# Patient Record
Sex: Female | Born: 1965 | Race: Black or African American | Hispanic: No | Marital: Single | Smoking: Never smoker
Health system: Southern US, Community
[De-identification: ages and names within clinical notes are randomized; demographics above are authoritative.]

## PROBLEM LIST (undated history)

## (undated) DIAGNOSIS — K5900 Constipation, unspecified: Secondary | ICD-10-CM

## (undated) DIAGNOSIS — I639 Cerebral infarction, unspecified: Secondary | ICD-10-CM

## (undated) DIAGNOSIS — T7840XA Allergy, unspecified, initial encounter: Secondary | ICD-10-CM

## (undated) DIAGNOSIS — E119 Type 2 diabetes mellitus without complications: Secondary | ICD-10-CM

## (undated) DIAGNOSIS — I10 Essential (primary) hypertension: Secondary | ICD-10-CM

## (undated) DIAGNOSIS — C801 Malignant (primary) neoplasm, unspecified: Secondary | ICD-10-CM

## (undated) HISTORY — DX: Essential (primary) hypertension: I10

## (undated) HISTORY — DX: Cerebral infarction, unspecified: I63.9

## (undated) HISTORY — DX: Type 2 diabetes mellitus without complications: E11.9

## (undated) HISTORY — DX: Allergy, unspecified, initial encounter: T78.40XA

## (undated) HISTORY — DX: Malignant (primary) neoplasm, unspecified: C80.1

## (undated) HISTORY — DX: Constipation, unspecified: K59.00

## (undated) HISTORY — PX: CORONARY ARTERY BYPASS GRAFT: SHX141

---

## 1999-06-29 ENCOUNTER — Encounter: Payer: Self-pay | Admitting: Family Medicine

## 1999-06-29 ENCOUNTER — Encounter: Admission: RE | Admit: 1999-06-29 | Discharge: 1999-06-29 | Payer: Self-pay | Admitting: Family Medicine

## 2002-03-08 ENCOUNTER — Other Ambulatory Visit: Admission: RE | Admit: 2002-03-08 | Discharge: 2002-03-08 | Payer: Self-pay | Admitting: Family Medicine

## 2003-05-30 ENCOUNTER — Emergency Department (HOSPITAL_COMMUNITY): Admission: EM | Admit: 2003-05-30 | Discharge: 2003-05-30 | Payer: Self-pay | Admitting: Emergency Medicine

## 2008-01-25 DIAGNOSIS — R011 Cardiac murmur, unspecified: Secondary | ICD-10-CM

## 2008-01-25 HISTORY — DX: Cardiac murmur, unspecified: R01.1

## 2008-03-06 ENCOUNTER — Ambulatory Visit: Payer: Self-pay | Admitting: Family Medicine

## 2008-03-07 ENCOUNTER — Ambulatory Visit: Payer: Self-pay | Admitting: Family Medicine

## 2008-03-07 ENCOUNTER — Encounter: Payer: Self-pay | Admitting: Family Medicine

## 2008-03-07 ENCOUNTER — Telehealth: Payer: Self-pay | Admitting: Family Medicine

## 2008-03-07 DIAGNOSIS — R011 Cardiac murmur, unspecified: Secondary | ICD-10-CM | POA: Insufficient documentation

## 2008-03-07 DIAGNOSIS — J309 Allergic rhinitis, unspecified: Secondary | ICD-10-CM | POA: Insufficient documentation

## 2008-03-07 LAB — CONVERTED CEMR LAB
BUN: 6 mg/dL (ref 6–23)
CO2: 25 meq/L (ref 19–32)
Calcium: 9.4 mg/dL (ref 8.4–10.5)
Chloride: 99 meq/L (ref 96–112)
Creatinine, Ser: 0.67 mg/dL (ref 0.40–1.20)
Glucose, Bld: 103 mg/dL — ABNORMAL HIGH (ref 70–99)
Potassium: 4.1 meq/L (ref 3.5–5.3)
Sodium: 138 meq/L (ref 135–145)

## 2008-03-09 DIAGNOSIS — I10 Essential (primary) hypertension: Secondary | ICD-10-CM | POA: Insufficient documentation

## 2008-04-02 ENCOUNTER — Encounter: Payer: Self-pay | Admitting: Family Medicine

## 2008-04-16 ENCOUNTER — Encounter: Payer: Self-pay | Admitting: Family Medicine

## 2008-04-16 ENCOUNTER — Ambulatory Visit: Payer: Self-pay | Admitting: Family Medicine

## 2008-04-16 DIAGNOSIS — E669 Obesity, unspecified: Secondary | ICD-10-CM | POA: Insufficient documentation

## 2008-04-16 LAB — CONVERTED CEMR LAB
BUN: 10 mg/dL (ref 6–23)
CO2: 24 meq/L (ref 19–32)
Calcium: 9.2 mg/dL (ref 8.4–10.5)
Chloride: 100 meq/L (ref 96–112)
Cholesterol: 189 mg/dL (ref 0–200)
Creatinine, Ser: 0.74 mg/dL (ref 0.40–1.20)
Glucose, Bld: 103 mg/dL — ABNORMAL HIGH (ref 70–99)
HCT: 36.6 % (ref 36.0–46.0)
HDL: 81 mg/dL (ref >39)
Hemoglobin: 12.1 g/dL (ref 12.0–15.0)
LDL Cholesterol: 92 mg/dL (ref 0–99)
MCHC: 33.1 g/dL (ref 30.0–36.0)
MCV: 86.7 fL (ref 78.0–100.0)
Platelets: 389 10*3/uL (ref 150–400)
Potassium: 3.8 meq/L (ref 3.5–5.3)
RBC: 4.22 M/uL (ref 3.87–5.11)
RDW: 16 % — ABNORMAL HIGH (ref 11.5–15.5)
Sodium: 138 meq/L (ref 135–145)
Total CHOL/HDL Ratio: 2.3
Triglycerides: 80 mg/dL (ref <150)
VLDL: 16 mg/dL (ref 0–40)
WBC: 6.9 10*3/uL (ref 4.0–10.5)

## 2008-04-23 ENCOUNTER — Ambulatory Visit (HOSPITAL_COMMUNITY): Admission: RE | Admit: 2008-04-23 | Discharge: 2008-04-23 | Payer: Self-pay | Admitting: Family Medicine

## 2009-03-31 ENCOUNTER — Telehealth: Payer: Self-pay | Admitting: Family Medicine

## 2009-05-01 ENCOUNTER — Ambulatory Visit: Payer: Self-pay | Admitting: Family Medicine

## 2009-05-01 ENCOUNTER — Other Ambulatory Visit: Admission: RE | Admit: 2009-05-01 | Discharge: 2009-05-01 | Payer: Self-pay | Admitting: Family Medicine

## 2009-05-01 LAB — CONVERTED CEMR LAB: Pap Smear: NEGATIVE

## 2009-05-05 ENCOUNTER — Encounter: Payer: Self-pay | Admitting: Family Medicine

## 2009-06-02 ENCOUNTER — Ambulatory Visit: Payer: Self-pay | Admitting: Family Medicine

## 2009-06-10 ENCOUNTER — Ambulatory Visit (HOSPITAL_COMMUNITY): Admission: RE | Admit: 2009-06-10 | Discharge: 2009-06-10 | Payer: Self-pay | Admitting: Family Medicine

## 2010-02-14 ENCOUNTER — Encounter: Payer: Self-pay | Admitting: Family Medicine

## 2010-02-23 NOTE — Letter (Signed)
Summary: Results Follow-up Letter  [facility]  [address]   [place], [place] [address]   Phone: [phone]  Fax: [phone]    05/05/2009  [address] [place], [place]  [address]  Dear Ms. Payton,   The following are the results of your recent test(s):  Test     Result     Pap Smear    Normal___x__  Not Normal_____       Please keep your appointment as scheduled.    Sincerely,  Lequita Asal  MD [facility]           Appended Document: Results Follow-up Letter mailed.

## 2010-02-23 NOTE — Assessment & Plan Note (Signed)
Summary: cpe,tcb   Vital Signs:  Patient profile:   45 year old female LMP:     [id number] Weight:      197.7 pounds BMI:     30.62 Temp:     98.3 degrees F oral Pulse rate:   96 / minute Pulse rhythm:   regular BP sitting:   171 / 117  (left arm) Cuff size:   regular  Vitals Entered By: Loralee Pacas CMA (May 01, 2009 8:55 AM)  Nutrition Counseling: Patient's BMI is greater than 25 and therefore counseled on weight management options.  Serial Vital Signs/Assessments:  Time      Position  BP       Pulse  Resp  Temp     By                     154/108                        Lequita Asal  MD  LMP (date): [id number]     Enter LMP: [id number] Last PAP Result NEGATIVE FOR INTRAEPITHELIAL LESIONS OR MALIGNANCY.   Primary Care Provider:  Lequita Asal  MD   History of Present Illness: 45 y/o here for CPE  HTN- BP deteriorated. has been out meds x1 month. rx sent to wrong pharmacy. denies CP, SOB, edema, palpitations, headache, blurred vision.   overweight- has gained 22 pounds since last visit. reports no current exercise.   social- not dating. lives with daughter. 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 denies symptoms of depression.   Habits & Providers  Alcohol-Tobacco-Diet     Tobacco Status: never     Tobacco Counseling: not indicated; no tobacco use  Exercise-Depression-Behavior     Have you felt down or hopeless? no     Have you felt little pleasure in things? no     Depression Counseling: not indicated; screening negative for depression     STD Risk: never     STD Risk Counseling: not indicated-no STD risk noted  Current Medications (verified): 1)  None  Allergies (verified): No Known Drug Allergies  Past History:  Past medical, surgical, family and social histories (including risk factors) reviewed, and no changes noted (except as noted below).  Past Medical History: Reviewed history from 03/07/2008 and no changes  required. Hypertension Allergic rhinitis    Past Surgical History: Reviewed history from 03/07/2008 and no changes required. none  Family History: Reviewed history from 03/07/2008 and no changes required. Father - CAD, CABG age 64, CVA age 28, Diabetes age 63  (twin) Brother - CAD, CABG age 63, Diabetes age 33 Mother - Hypertension, Arthritis    Social History: Reviewed history from 03/07/2008 and no changes required. currently working at [employer]. Recently completed associates degree for medical coding/billing. Has 1 daughter who lives with her, age 2. denies EtOH, drugs, tobacco. Single.  STD Risk:  never  Physical Exam  General:  obese female. NAD. vitals reviewed.  Eyes:  No corneal or conjunctival inflammation noted. EOMI. Perrla. Vision grossly normal. Ears:  External ear exam shows no significant lesions or deformities.  Hearing is grossly normal bilaterally. Nose:  External nasal examination shows no deformity or inflammation. Nasal mucosa are pink and moist without lesions or exudates. Mouth:  Oral mucosa and oropharynx without lesions or exudates.  Teeth in good repair. Neck:  No deformities, masses, or tenderness noted. Breasts:  deferred Lungs:  Normal respiratory  effort, chest expands symmetrically. Lungs are clear to auscultation, no crackles or wheezes. Heart:  Normal rate and regular rhythm. S1 and S2 normal. II/VI SEM at LUSB Abdomen:  Bowel sounds positive,abdomen soft and non-tender without masses, organomegaly or hernias noted. Genitalia:  Normal introitus for age, no external lesions, no vaginal discharge, mucosa pink and moist, no vaginal or cervical lesions, no vaginal atrophy, no friaility or hemorrhage, normal uterus size and position, no adnexal masses or tenderness Msk:  No deformity or scoliosis noted of thoracic or lumbar spine.   Pulses:  R and L carotid,radial,dorsalis pedis and posterior tibial pulses are full and equal  bilaterally Extremities:  No clubbing, cyanosis, edema, or deformity noted with normal full range of motion of all joints.   Neurologic:  alert & oriented X3, cranial nerves II-XII intact, strength normal in all extremities, and gait normal.   Skin:  Intact without suspicious lesions or rashes Cervical Nodes:  No lymphadenopathy noted Psych:  Cognition and judgment appear intact. Alert and cooperative with normal attention span and concentration. No apparent delusions, illusions, hallucinations   Impression & Recommendations:  Problem # 1:  ROUTINE GYNECOLOGICAL EXAMINATION (ICD-V72.31) Assessment Unchanged  pap completed. if normal would go to less frequent testing. long discussion about weight. patient to call and schedule mammogram  Orders: [facility] - Est  40-64 yrs ([address])  Problem # 2:  HYPERTENSION, BENIGN ESSENTIAL, UNCONTROLLED (ICD-401.1) Assessment: Deteriorated patient out of meds. refill sent to pharmacy. f/u 1 month  Her updated medication list for this problem includes:    Lisinopril-hydrochlorothiazide 20-25 Mg Tabs (Lisinopril-hydrochlorothiazide) ..... One tab by mouth daily  Problem # 3:  OBESITY (ICD-278.00) Assessment: Deteriorated patient counseled to exercise most days weekly for at least 30 minutes and to modify eating behaviors.   Other Orders: Pap Smear-[facility] ([id number])  Patient Instructions: 1)  Follow up with Dr. Lanier Prude in 1 month about blood pressure.    Prevention & Chronic Care Immunizations   Influenza vaccine: refused  (04/16/2008)   Influenza vaccine due: 04/16/2009    Tetanus booster: 04/16/2008: given   Tetanus booster due: 04/17/2018    Pneumococcal vaccine: Not documented  Other Screening   Pap smear: NEGATIVE FOR INTRAEPITHELIAL LESIONS OR MALIGNANCY.  (04/16/2008)    Mammogram: ASSESSMENT: Negative - BI-RADS 1^MM DIGITAL SCREENING  (04/23/2008)   Smoking status: never  (05/01/2009)  Lipids   Total Cholesterol: 189   (04/16/2008)   LDL: 92  (04/16/2008)   LDL Direct: Not documented   HDL: 81  (04/16/2008)   Triglycerides: 80  (04/16/2008)  Hypertension   Last Blood Pressure: 171 / 117  (05/01/2009)   Serum creatinine: 0.74  (04/16/2008)   Serum potassium 3.8  (04/16/2008)    Hypertension flowsheet reviewed?: Yes   Progress toward BP goal: Deteriorated  Self-Management Support :   Personal Goals (by the next clinic visit) :      Personal blood pressure goal: 140/90  (05/01/2009)   Patient will work on the following items until the next clinic visit to reach self-care goals:     Medications and monitoring: take my medicines every day, check my blood pressure  (05/01/2009)     Eating: eat foods that are low in salt, eat baked foods instead of fried foods  (05/01/2009)     Activity: take a 30 minute walk every day  (05/01/2009)    Hypertension self-management support: Written self-care plan, Education handout  (05/01/2009)   Hypertension self-care plan printed.   Hypertension education handout printed

## 2010-02-23 NOTE — Progress Notes (Signed)
Summary: Rx Req  Phone Note Refill Request Call back at Home Phone [phone] Message from:  Patient  Refills Requested: Medication #1:  LISINOPRIL-HYDROCHLOROTHIAZIDE 10-12.5 MG TABS one tab by mouth daily. PT USES [facility] [address]  Initial call taken by: Clydell Hakim,  March 31, 2009 3:01 PM  Follow-up for Phone Call        rx for 1 month sent today. patient needs appt for any further refills Follow-up by: Lequita Asal  MD,  March 31, 2009 4:15 PM  Additional Follow-up for Phone Call Additional follow up Details #1::        Called No answer Additional Follow-up by: Gladstone Pih,  April 01, 2009 1:35 PM

## 2010-02-23 NOTE — Assessment & Plan Note (Signed)
Summary: FU BP/KH   Vital Signs:  Patient profile:   45 year old female Height:      67.5 inches Weight:      196.4 pounds BMI:     30.42 Temp:     97.9 degrees F oral Pulse rate:   87 / minute BP sitting:   124 / 88  (left arm) Cuff size:   regular  Vitals Entered By: Gladstone Pih (Jun 02, 2009 2:11 PM)  Serial Vital Signs/Assessments:  Time      Position  BP       Pulse  Resp  Temp     By 2:13 PM             124/88                         Gladstone Pih  Comments: 2:13 PM re checked manually By: Gladstone Pih   CC: F/U HTN Is Patient Diabetic? No Pain Assessment Patient in pain? no        Primary Care Provider:  Lequita Asal  MD  CC:  F/U HTN.  History of Present Illness: 45 y/o female here for f/u HTN  HTN- on lisinopril-hctz. taking daily. no issues. denies chest pain, palpitations, headache, blurred vision, peripheral edema.   Habits & Providers  Alcohol-Tobacco-Diet     Tobacco Status: never  -  Date:  06/02/2009    SBP 124    DBP 88  Current Medications (verified): 1)  Lisinopril-Hydrochlorothiazide 20-25 Mg Tabs (Lisinopril-Hydrochlorothiazide) .... One Tab By Mouth Daily  Allergies (verified): No Known Drug Allergies  Physical Exam  General:  obese female. NAD. vitals reviewed.  Mouth:  Oral mucosa and oropharynx without lesions or exudates.  Teeth in good repair. Lungs:  Normal respiratory effort, chest expands symmetrically. Lungs are clear to auscultation, no crackles or wheezes. Heart:  Normal rate and regular rhythm. S1 and S2 normal. II/VI SEM at LUSB Extremities:  No clubbing, cyanosis, edema, or deformity noted with normal full range of motion of all joints.     Impression & Recommendations:  Problem # 1:  HYPERTENSION, BENIGN ESSENTIAL, UNCONTROLLED (ICD-401.1) Assessment Improved  no changes.   Her updated medication list for this problem includes:    Lisinopril-hydrochlorothiazide 20-25 Mg Tabs  (Lisinopril-hydrochlorothiazide) ..... One tab by mouth daily  Orders: [facility]- Est Level  3 ([address]) Prescriptions: LISINOPRIL-HYDROCHLOROTHIAZIDE 20-25 MG TABS (LISINOPRIL-HYDROCHLOROTHIAZIDE) one tab by mouth daily  #30 x 3   Entered and Authorized by:   Lequita Asal  MD   Signed by:   Lequita Asal  MD on 06/02/2009   Method used:   Electronically to        [facility] [phone]* (retail)       [address]       [place], [place]  [address]       Ph: [phone]       Fax: [phone]   RxID:   [id number]    Prevention & Chronic Care Immunizations   Influenza vaccine: refused  (04/16/2008)   Influenza vaccine due: 04/16/2009    Tetanus booster: 04/16/2008: given   Tetanus booster due: 04/17/2018    Pneumococcal vaccine: Not documented  Other Screening   Pap smear: NEGATIVE FOR INTRAEPITHELIAL LESIONS OR MALIGNANCY.  (05/01/2009)    Mammogram: ASSESSMENT: Negative - BI-RADS 1^MM DIGITAL SCREENING  (04/23/2008)   Smoking status: never  (06/02/2009)  Lipids   Total Cholesterol: 189  (04/16/2008)   LDL:  92  (04/16/2008)   LDL Direct: Not documented   HDL: 81  (04/16/2008)   Triglycerides: 80  (04/16/2008)  Hypertension   Last Blood Pressure: 124 / 88  (06/02/2009)   Serum creatinine: 0.74  (04/16/2008)   Serum potassium 3.8  (04/16/2008)    Hypertension flowsheet reviewed?: Yes   Progress toward BP goal: At goal  Self-Management Support :   Personal Goals (by the next clinic visit) :      Personal blood pressure goal: 140/90  (05/01/2009)   Hypertension self-management support: Written self-care plan, Education handout  (05/01/2009)    Hypertension self-management support not done because: Good outcomes  (06/02/2009)

## 2013-11-21 ENCOUNTER — Other Ambulatory Visit: Payer: Self-pay | Admitting: Family Medicine

## 2013-11-21 DIAGNOSIS — Z1231 Encounter for screening mammogram for malignant neoplasm of breast: Secondary | ICD-10-CM

## 2013-11-25 ENCOUNTER — Encounter (INDEPENDENT_AMBULATORY_CARE_PROVIDER_SITE_OTHER): Payer: Self-pay

## 2013-11-25 ENCOUNTER — Ambulatory Visit
Admission: RE | Admit: 2013-11-25 | Discharge: 2013-11-25 | Disposition: A | Discharge status: Home / Self Care | Payer: Private Health Insurance - Indemnity | Source: Ambulatory Visit | Attending: Family Medicine | Admitting: Family Medicine

## 2013-11-25 DIAGNOSIS — Z1231 Encounter for screening mammogram for malignant neoplasm of breast: Secondary | ICD-10-CM

## 2014-03-26 ENCOUNTER — Encounter: Payer: Self-pay | Admitting: Internal Medicine

## 2014-05-12 ENCOUNTER — Ambulatory Visit (AMBULATORY_SURGERY_CENTER): Payer: Self-pay | Admitting: *Deleted

## 2014-05-12 VITALS — Ht 67.0 in | Wt 170.0 lb

## 2014-05-12 DIAGNOSIS — Z1211 Encounter for screening for malignant neoplasm of colon: Secondary | ICD-10-CM

## 2014-05-12 MED ORDER — MOVIPREP 100 G PO SOLR: 1.0000 | Freq: Once | ORAL | Status: DC

## 2014-05-12 NOTE — Progress Notes (Signed)
No egg or soy allergy No diet pills No home 02 use No past sedation emmi video to e mail

## 2014-05-26 ENCOUNTER — Ambulatory Visit (AMBULATORY_SURGERY_CENTER): Payer: Managed Care, Other (non HMO) | Admitting: Internal Medicine

## 2014-05-26 ENCOUNTER — Encounter: Payer: Self-pay | Admitting: Internal Medicine

## 2014-05-26 VITALS — BP 121/79 | HR 75 | Temp 98.6°F | Resp 11 | Ht 67.0 in | Wt 170.0 lb

## 2014-05-26 DIAGNOSIS — Z1211 Encounter for screening for malignant neoplasm of colon: Secondary | ICD-10-CM

## 2014-05-26 MED ORDER — SODIUM CHLORIDE 0.9 % IV SOLN: 500.0000 mL | INTRAVENOUS | Status: DC

## 2014-05-26 NOTE — Op Note (Signed)
[facility]  [address]. [place], [address]   COLONOSCOPY PROCEDURE REPORT  PATIENT: Jenna, Myers  MR#: [id number] BIRTHDATE: 1965-09-23 , 49  yrs. old GENDER: female ENDOSCOPIST: Jerene Bears, MD PROCEDURE DATE:  05/26/2014 PROCEDURE:   Colonoscopy, screening First Screening Colonoscopy - Avg.  risk and is 50 yrs.  old or older Yes.  Prior Negative Screening - Now for repeat screening. N/A  History of Adenoma - Now for follow-up colonoscopy & has been > or = to 3 yrs.  N/A ASA CLASS:   Class II INDICATIONS:Screening for colonic neoplasia and Colorectal Neoplasm Risk Assessment for this procedure is average risk. MEDICATIONS: Monitored anesthesia care and Propofol 300 mg IV  DESCRIPTION OF PROCEDURE:   After the risks benefits and alternatives of the procedure were thoroughly explained, informed consent was obtained.  The digital rectal exam revealed no rectal mass.   The LB [id number]  endoscope was introduced through the anus and advanced to the terminal ileum which was intubated for a short distance. No adverse events experienced.   The quality of the prep was excellent.  (MoviPrep was used)  The instrument was then slowly withdrawn as the colon was fully examined.      COLON FINDINGS: The examined terminal ileum appeared to be normal. There was mild diverticulosis noted in the ascending colon, descending colon, and sigmoid colon.   The examination was otherwise normal.  Retroflexed views revealed no abnormalities. The time to cecum = 7.1 Withdrawal time = 8.7   The scope was withdrawn and the procedure completed.  COMPLICATIONS: There were no immediate complications.  ENDOSCOPIC IMPRESSION: 1.   The examined terminal ileum appeared to be normal 2.   Mild diverticulosis was noted in the ascending colon, descending colon, and sigmoid colon 3.   The examination was otherwise normal  RECOMMENDATIONS: 1.  High fiber diet 2.  You should  continue to follow colorectal cancer screening guidelines for "routine risk" patients with a repeat colonoscopy in 10 years.  There is no need for FOBT (stool) testing for at least 5 years.  eSigned:  Jerene Bears, MD 05/26/2014 8:31 AM   cc:  the patient

## 2014-05-26 NOTE — Progress Notes (Signed)
Report to PACU, RN, vss, BBS= Clear.  

## 2014-05-26 NOTE — Patient Instructions (Signed)
Discharge instructions given. Handouts on diverticulosis and a high fiber diet Resume previous medications. YOU HAD AN ENDOSCOPIC PROCEDURE TODAY AT THE [facility]:   Refer to the procedure report that was given to you for any specific questions about what was found during the examination.  If the procedure report does not answer your questions, please call your gastroenterologist to clarify.  If you requested that your care partner not be given the details of your procedure findings, then the procedure report has been included in a sealed envelope for you to review at your convenience later.  YOU SHOULD EXPECT: Some feelings of bloating in the abdomen. Passage of more gas than usual.  Walking can help get rid of the air that was put into your GI tract during the procedure and reduce the bloating. If you had a lower endoscopy (such as a colonoscopy or flexible sigmoidoscopy) you may notice spotting of blood in your stool or on the toilet paper. If you underwent a bowel prep for your procedure, you may not have a normal bowel movement for a few days.  Please Note:  You might notice some irritation and congestion in your nose or some drainage.  This is from the oxygen used during your procedure.  There is no need for concern and it should clear up in a day or so.  SYMPTOMS TO REPORT IMMEDIATELY:   Following lower endoscopy (colonoscopy or flexible sigmoidoscopy):  Excessive amounts of blood in the stool  Significant tenderness or worsening of abdominal pains  Swelling of the abdomen that is new, acute  Fever of 100F or higher   For urgent or emergent issues, a gastroenterologist can be reached at any hour by calling [phone].   DIET: Your first meal following the procedure should be a small meal and then it is ok to progress to your normal diet. Heavy or fried foods are harder to digest and may make you feel nauseous or bloated.  Likewise, meals heavy in dairy and vegetables  can increase bloating.  Drink plenty of fluids but you should avoid alcoholic beverages for 24 hours.  ACTIVITY:  You should plan to take it easy for the rest of today and you should NOT DRIVE or use heavy machinery until tomorrow (because of the sedation medicines used during the test).    FOLLOW UP: Our staff will call the number listed on your records the next business day following your procedure to check on you and address any questions or concerns that you may have regarding the information given to you following your procedure. If we do not reach you, we will leave a message.  However, if you are feeling well and you are not experiencing any problems, there is no need to return our call.  We will assume that you have returned to your regular daily activities without incident.  If any biopsies were taken you will be contacted by phone or by letter within the next 1-3 weeks.  Please call us at [phone] if you have not heard about the biopsies in 3 weeks.    SIGNATURES/CONFIDENTIALITY: You and/or your care partner have signed paperwork which will be entered into your electronic medical record.  These signatures attest to the fact that that the information above on your After Visit Summary has been reviewed and is understood.  Full responsibility of the confidentiality of this discharge information lies with you and/or your care-partner. 

## 2014-05-27 ENCOUNTER — Telehealth: Payer: Self-pay

## 2014-05-27 NOTE — Telephone Encounter (Signed)
Duplicate entry

## 2014-05-27 NOTE — Telephone Encounter (Signed)
  Follow up Call-  Call back number 05/26/2014  Post procedure Call Back phone  # [phone]  Permission to leave phone message Yes     Patient questions:  Do you have a fever, pain , or abdominal swelling? No. Pain Score  0 *  Have you tolerated food without any problems? Yes.    Have you been able to return to your normal activities? Yes.    Do you have any questions about your discharge instructions: Diet   No. Medications  No. Follow up visit  No.  Do you have questions or concerns about your Care? No.  Actions: * If pain score is 4 or above: No action needed, pain <4.

## 2014-05-27 NOTE — Telephone Encounter (Signed)
  Follow up Call-  Call back number 05/26/2014  Post procedure Call Back phone  # [phone]  Permission to leave phone message Yes     Patient questions:  Do you have a fever, pain , or abdominal swelling? No. Pain Score  0 *  Have you tolerated food without any problems? Yes.    Have you been able to return to your normal activities? Yes.    Do you have any questions about your discharge instructions: Diet   No. Medications  No. Follow up visit  No.  Do you have questions or concerns about your Care? No.  Actions: * If pain score is 4 or above: No action needed, pain <4.

## 2015-01-02 ENCOUNTER — Other Ambulatory Visit: Payer: Self-pay

## 2015-01-02 DIAGNOSIS — Z1231 Encounter for screening mammogram for malignant neoplasm of breast: Secondary | ICD-10-CM

## 2015-01-05 ENCOUNTER — Ambulatory Visit
Admission: RE | Admit: 2015-01-05 | Discharge: 2015-01-05 | Disposition: A | Discharge status: Home / Self Care | Payer: Managed Care, Other (non HMO) | Source: Ambulatory Visit

## 2015-01-05 DIAGNOSIS — Z1231 Encounter for screening mammogram for malignant neoplasm of breast: Secondary | ICD-10-CM

## 2015-12-29 ENCOUNTER — Other Ambulatory Visit: Payer: Self-pay | Admitting: Specialist

## 2015-12-29 DIAGNOSIS — Z1231 Encounter for screening mammogram for malignant neoplasm of breast: Secondary | ICD-10-CM

## 2016-02-03 ENCOUNTER — Ambulatory Visit
Admission: RE | Admit: 2016-02-03 | Discharge: 2016-02-03 | Disposition: A | Discharge status: Home / Self Care | Payer: 59 | Source: Ambulatory Visit | Attending: Specialist | Admitting: Specialist

## 2016-02-03 DIAGNOSIS — Z1231 Encounter for screening mammogram for malignant neoplasm of breast: Secondary | ICD-10-CM

## 2017-01-20 ENCOUNTER — Other Ambulatory Visit: Payer: Self-pay | Admitting: Family Medicine

## 2017-01-20 DIAGNOSIS — Z1231 Encounter for screening mammogram for malignant neoplasm of breast: Secondary | ICD-10-CM

## 2017-02-13 ENCOUNTER — Ambulatory Visit
Admission: RE | Admit: 2017-02-13 | Discharge: 2017-02-13 | Disposition: A | Discharge status: Home / Self Care | Payer: 59 | Source: Ambulatory Visit | Attending: Family Medicine | Admitting: Family Medicine

## 2017-02-13 DIAGNOSIS — Z1231 Encounter for screening mammogram for malignant neoplasm of breast: Secondary | ICD-10-CM

## 2018-12-04 ENCOUNTER — Other Ambulatory Visit: Payer: Self-pay

## 2018-12-04 ENCOUNTER — Ambulatory Visit
Admission: EM | Admit: 2018-12-04 | Discharge: 2018-12-04 | Disposition: A | Discharge status: Home / Self Care | Payer: 59 | Attending: Physician Assistant | Admitting: Physician Assistant

## 2018-12-04 DIAGNOSIS — J069 Acute upper respiratory infection, unspecified: Secondary | ICD-10-CM

## 2018-12-04 DIAGNOSIS — Z20828 Contact with and (suspected) exposure to other viral communicable diseases: Secondary | ICD-10-CM | POA: Diagnosis not present

## 2018-12-04 DIAGNOSIS — I10 Essential (primary) hypertension: Secondary | ICD-10-CM

## 2018-12-04 DIAGNOSIS — J029 Acute pharyngitis, unspecified: Secondary | ICD-10-CM

## 2018-12-04 MED ORDER — LISINOPRIL 20 MG PO TABS: 20.0000 mg | ORAL_TABLET | Freq: Every day | ORAL | 0 refills | Status: DC

## 2018-12-04 MED ORDER — BENZONATATE 100 MG PO CAPS
100.0000 mg | ORAL_CAPSULE | Freq: Three times a day (TID) | ORAL | 0 refills | Status: DC
Start: 2018-12-04 — End: 2019-03-25

## 2018-12-04 MED ORDER — GLIPIZIDE ER 2.5 MG PO TB24: 2.5000 mg | ORAL_TABLET | Freq: Every day | ORAL | 0 refills | Status: DC

## 2018-12-04 MED ORDER — IPRATROPIUM BROMIDE 0.06 % NA SOLN: 2.0000 | Freq: Four times a day (QID) | NASAL | 0 refills | Status: DC

## 2018-12-04 MED ORDER — HYDROCHLOROTHIAZIDE 25 MG PO TABS: 25.0000 mg | ORAL_TABLET | Freq: Every day | ORAL | 0 refills | Status: DC

## 2018-12-04 NOTE — Discharge Instructions (Signed)
As discussed, cannot rule out COVID. Currently, no alarming signs. Testing ordered. I would like you to quarantine until testing results. Start tessalon for cough. Atrovent for drainage. If experiencing shortness of breath, trouble breathing, go to the emergency department for further evaluation needed.   Medicines refilled for 90 days, follow up with PCP for further evaluation needed.

## 2018-12-04 NOTE — ED Triage Notes (Signed)
Pt c/o sore throat, cough, fever, and "feeling bad" all day at work. States "lotes of positive COVID people their". States hasn't took her homes meds in months d/t unable to see the PCP d/t COVID

## 2018-12-04 NOTE — ED Provider Notes (Signed)
[facility]    CSN: [id number] Arrival date & time: 12/04/18  1847      History   Chief Complaint Chief Complaint  Patient presents with  . Sore Throat    HPI Jenna Myers is a 53 y.o. female.   53 year old female comes in for 2 day history of URI symptoms. Has had sore throat, cough, rhinorrhea, nasal congestion, body aches, subjective fever. Nausea without vomiting. Denies abdominal pain, diarrhea. Denies shortness of breath, loss of taste/smell.  Denies weakness, dizziness, syncope.  Denies chest pain, palpitations. Positive COVID contact. Motrin 400mg  2 hours ago. Never smoker.   Patient hypertensive, with tachycardia at triage.  States has been out of her blood pressure medicine for a few months.  Has also been out of diabetic medications.     Past Medical History:  Diagnosis Date  . Allergy    seasonal  . Constipation    prune juice and probiotic helps  . Diabetes mellitus without complication (HCC)    questionable-Hgb A1C down to 6.7  . Hypertension     Patient Active Problem List   Diagnosis Date Noted  . OBESITY 04/16/2008  . HYPERTENSION, BENIGN ESSENTIAL, UNCONTROLLED 03/09/2008  . ALLERGIC RHINITIS 03/07/2008  . HEART MURMUR, SYSTOLIC [id number]    Past Surgical History:  Procedure Laterality Date  . VAGINAL DELIVERY     x1    OB History   No obstetric history on file.      Home Medications    Prior to Admission medications   Medication Sig Start Date End Date Taking? Authorizing Provider  benzonatate (TESSALON) 100 MG capsule Take 1 capsule (100 mg total) by mouth every 8 (eight) hours. 12/04/18   Tasia Catchings,  V, PA-C  glipiZIDE (GLUCOTROL XL) 2.5 MG 24 hr tablet Take 1 tablet (2.5 mg total) by mouth daily with breakfast. 12/04/18   Tasia Catchings,  V, PA-C  hydrochlorothiazide (HYDRODIURIL) 25 MG tablet Take 1 tablet (25 mg total) by mouth daily. 12/04/18   Tasia Catchings,  V, PA-C  ipratropium (ATROVENT) 0.06 % nasal spray Place 2 sprays into  both nostrils 4 (four) times daily. 12/04/18   Tasia Catchings,  V, PA-C  lisinopril (ZESTRIL) 20 MG tablet Take 1 tablet (20 mg total) by mouth daily. 12/04/18   Tasia Catchings,  V, PA-C  Multiple Vitamins-Minerals (MULTIVITAMIN ADULT PO) Take 1 capsule by mouth daily.    [provider]  Probiotic Product (PROBIOTIC PO) Take 1 capsule by mouth daily. tru biotic daily    [provider]    Family History Family History  Problem Relation Age of Onset  . Heart disease Father   . Colon cancer Neg Hx     Social History Social History   Tobacco Use  . Smoking status: Never Smoker  . Smokeless tobacco: Never Used  Substance Use Topics  . Alcohol use: No    Alcohol/week: 0.0 standard drinks  . Drug use: No     Allergies   Patient has no known allergies.   Review of Systems Review of Systems  Reason unable to perform ROS: See HPI as above.     Physical Exam Triage Vital Signs ED Triage Vitals [12/04/18 1902]  Enc Vitals Group     BP (!) 181/118     Pulse Rate (!) 127     Resp 18     Temp 100.1 F (37.8 C)     Temp Source Oral     SpO2 95 %  Weight      Height      Head Circumference      Peak Flow      Pain Score 0     Pain Loc      Pain Edu?      Excl. in [place]?    No data found.  Updated Vital Signs BP (!) 181/118 (BP Location: Left Arm)   Pulse (!) 127   Temp 100.1 F (37.8 C) (Oral)   Resp 18   SpO2 95%   Physical Exam Constitutional:      General: She is not in acute distress.    Appearance: Normal appearance. She is not ill-appearing, toxic-appearing or diaphoretic.  HENT:     Head: Normocephalic and atraumatic.     Right Ear: Tympanic membrane and ear canal normal.     Left Ear: Tympanic membrane and ear canal normal.     Mouth/Throat:     Mouth: Mucous membranes are moist.     Pharynx: Oropharynx is clear. Uvula midline.     Tonsils: No tonsillar exudate. 2+ on the right. 2+ on the left.  Neck:     Musculoskeletal: Normal range of motion  and neck supple.  Cardiovascular:     Rate and Rhythm: Regular rhythm. Tachycardia present.     Heart sounds: Normal heart sounds. No murmur. No friction rub. No gallop.   Pulmonary:     Effort: Pulmonary effort is normal. No accessory muscle usage, prolonged expiration, respiratory distress or retractions.     Comments: Lungs clear to auscultation without adventitious lung sounds. Neurological:     General: No focal deficit present.     Mental Status: She is alert and oriented to person, place, and time.      UC Treatments / Results  Labs (all labs ordered are listed, but only abnormal results are displayed) Labs Reviewed  NOVEL CORONAVIRUS, NAA    EKG   Radiology No results found.  Procedures Procedures (including critical care time)  Medications Ordered in UC Medications - No data to display  Initial Impression / Assessment and Plan / UC Course  I have reviewed the triage vital signs and the nursing notes.  Pertinent labs & imaging results that were available during my care of the patient were reviewed by me and considered in my medical decision making (see chart for details).    No alarming signs on exam.  Patient speaking in full sentences without respiratory distress.  COVID testing ordered.  Patient to quarantine until testing results return.  Symptomatic treatment discussed.  Push fluids.  Return precautions given.  Patient expresses understanding and agrees to plan.  We will refill patient's medications for 90 days.  Resources for PCP provided.  Return precautions given.   Final Clinical Impressions(s) / UC Diagnoses   Final diagnoses:  Sore throat  Viral URI   ED Prescriptions    Medication Sig Dispense Auth. Provider   glipiZIDE (GLUCOTROL XL) 2.5 MG 24 hr tablet Take 1 tablet (2.5 mg total) by mouth daily with breakfast. 90 tablet ,  V, PA-C   hydrochlorothiazide (HYDRODIURIL) 25 MG tablet Take 1 tablet (25 mg total) by mouth daily. 90 tablet ,   V, PA-C   lisinopril (ZESTRIL) 20 MG tablet Take 1 tablet (20 mg total) by mouth daily. 90 tablet ,  V, PA-C   benzonatate (TESSALON) 100 MG capsule Take 1 capsule (100 mg total) by mouth every 8 (eight) hours. 21 capsule Ok Edwards, PA-C  ipratropium (ATROVENT) 0.06 % nasal spray Place 2 sprays into both nostrils 4 (four) times daily. 15 mL Ok Edwards, PA-C     PDMP not reviewed this encounter.   Ok Edwards, PA-C 12/04/18 1944

## 2018-12-08 LAB — NOVEL CORONAVIRUS, NAA: SARS-CoV-2, NAA: NOT DETECTED

## 2018-12-11 ENCOUNTER — Ambulatory Visit
Admission: EM | Admit: 2018-12-11 | Discharge: 2018-12-11 | Disposition: A | Discharge status: Home / Self Care | Payer: 59 | Attending: Physician Assistant | Admitting: Physician Assistant

## 2018-12-11 ENCOUNTER — Encounter: Payer: Self-pay | Admitting: Physician Assistant

## 2018-12-11 ENCOUNTER — Telehealth: Payer: Self-pay | Admitting: Emergency Medicine

## 2018-12-11 DIAGNOSIS — J209 Acute bronchitis, unspecified: Secondary | ICD-10-CM | POA: Diagnosis not present

## 2018-12-11 MED ORDER — DOXYCYCLINE HYCLATE 100 MG PO CAPS: 100.0000 mg | ORAL_CAPSULE | Freq: Two times a day (BID) | ORAL | 0 refills | Status: DC

## 2018-12-11 MED ORDER — HYDROCODONE-HOMATROPINE 5-1.5 MG/5ML PO SYRP: 5.0000 mL | ORAL_SOLUTION | Freq: Four times a day (QID) | ORAL | 0 refills | Status: DC | PRN

## 2018-12-11 MED ORDER — ALBUTEROL SULFATE HFA 108 (90 BASE) MCG/ACT IN AERS: 1.0000 | INHALATION_SPRAY | Freq: Four times a day (QID) | RESPIRATORY_TRACT | 0 refills | Status: DC | PRN

## 2018-12-11 NOTE — ED Notes (Addendum)
APP assessment and discharge occurred prior to this RNs triage.

## 2018-12-11 NOTE — Telephone Encounter (Signed)
Pt states medications were sent to the wrong pharmacy.  Due to Hycodan being a narcotic, will have provider resend.

## 2018-12-11 NOTE — ED Notes (Signed)
Patient able to ambulate independently  

## 2018-12-11 NOTE — Discharge Instructions (Addendum)
Start Doxycycline as directed. Albuterol inhaler as needed. Hycodan as needed for cough, this can make you drowsy, make sure you are not going to operate heavy machinery while on medicine. Monitor for any worsening of symptoms, chest pain, shortness of breath, wheezing, swelling of the throat, go to the emergency department for further evaluation needed.

## 2018-12-11 NOTE — ED Provider Notes (Signed)
[facility]    CSN: [id number] Arrival date & time: 12/11/18  [id number]      History   Chief Complaint No chief complaint on file.   HPI Jenna Myers is a 53 y.o. female.   53 year old female returns for visit after being seen 12/04/2018. At the time, she had 2 day history of URI symptoms and was tested for COVID. COVID test negative. She has worsening cough in the past few days with increased sputum production.  She denies fever, chills, body aches.  Denies shortness of breath, trouble breathing, loss of taste or smell.  States Atrovent nasal spray was helping symptoms, but caused nosebleed, and therefore stopped the medicine. Never smoker.      Past Medical History:  Diagnosis Date  . Allergy    seasonal  . Constipation    prune juice and probiotic helps  . Diabetes mellitus without complication (HCC)    questionable-Hgb A1C down to 6.7  . Hypertension     Patient Active Problem List   Diagnosis Date Noted  . OBESITY 04/16/2008  . HYPERTENSION, BENIGN ESSENTIAL, UNCONTROLLED 03/09/2008  . ALLERGIC RHINITIS 03/07/2008  . HEART MURMUR, SYSTOLIC [id number]    Past Surgical History:  Procedure Laterality Date  . VAGINAL DELIVERY     x1    OB History   No obstetric history on file.      Home Medications    Prior to Admission medications   Medication Sig Start Date End Date Taking? Authorizing Provider  albuterol (VENTOLIN HFA) 108 (90 Base) MCG/ACT inhaler Inhale 1-2 puffs into the lungs every 6 (six) hours as needed for wheezing or shortness of breath. 12/11/18   Tasia Catchings,  V, PA-C  benzonatate (TESSALON) 100 MG capsule Take 1 capsule (100 mg total) by mouth every 8 (eight) hours. 12/04/18   Tasia Catchings,  V, PA-C  doxycycline (VIBRAMYCIN) 100 MG capsule Take 1 capsule (100 mg total) by mouth 2 (two) times daily. 12/11/18   Tasia Catchings,  V, PA-C  glipiZIDE (GLUCOTROL XL) 2.5 MG 24 hr tablet Take 1 tablet (2.5 mg total) by mouth daily with breakfast. 12/04/18    Tasia Catchings,  V, PA-C  hydrochlorothiazide (HYDRODIURIL) 25 MG tablet Take 1 tablet (25 mg total) by mouth daily. 12/04/18   Tasia Catchings,  V, PA-C  HYDROcodone-homatropine (HYCODAN) 5-1.5 MG/5ML syrup Take 5 mLs by mouth every 6 (six) hours as needed for cough. 12/11/18   Tasia Catchings,  V, PA-C  ipratropium (ATROVENT) 0.06 % nasal spray Place 2 sprays into both nostrils 4 (four) times daily. 12/04/18   Tasia Catchings,  V, PA-C  lisinopril (ZESTRIL) 20 MG tablet Take 1 tablet (20 mg total) by mouth daily. 12/04/18   Tasia Catchings,  V, PA-C  Multiple Vitamins-Minerals (MULTIVITAMIN ADULT PO) Take 1 capsule by mouth daily.    [provider]  Probiotic Product (PROBIOTIC PO) Take 1 capsule by mouth daily. tru biotic daily    [provider]    Family History Family History  Problem Relation Age of Onset  . Heart disease Father   . Colon cancer Neg Hx     Social History Social History   Tobacco Use  . Smoking status: Never Smoker  . Smokeless tobacco: Never Used  Substance Use Topics  . Alcohol use: No    Alcohol/week: 0.0 standard drinks  . Drug use: No     Allergies   Patient has no known allergies.   Review of Systems Review of Systems  Reason unable to perform  ROS: See HPI as above.     Physical Exam Triage Vital Signs ED Triage Vitals  Enc Vitals Group     BP      Pulse      Resp      Temp      Temp src      SpO2      Weight      Height      Head Circumference      Peak Flow      Pain Score      Pain Loc      Pain Edu?      Excl. in [place]?    No data found.  Updated Vital Signs BP (!) 178/106   Pulse (!) 101   Temp 98.6 F (37 C)   Resp 18   SpO2 94%   Physical Exam Constitutional:      General: She is not in acute distress.    Appearance: Normal appearance. She is not ill-appearing, toxic-appearing or diaphoretic.  HENT:     Head: Normocephalic and atraumatic.     Mouth/Throat:     Mouth: Mucous membranes are moist.     Pharynx: Oropharynx is clear. Uvula  midline.  Neck:     Musculoskeletal: Normal range of motion and neck supple.  Cardiovascular:     Rate and Rhythm: Normal rate and regular rhythm.     Heart sounds: Normal heart sounds. No murmur. No friction rub. No gallop.   Pulmonary:     Effort: Pulmonary effort is normal. No accessory muscle usage, prolonged expiration, respiratory distress or retractions.     Comments: Lungs clear to auscultation without adventitious lung sounds. Skin:    General: Skin is warm and dry.  Neurological:     General: No focal deficit present.     Mental Status: She is alert and oriented to person, place, and time.      UC Treatments / Results  Labs (all labs ordered are listed, but only abnormal results are displayed) Labs Reviewed - No data to display  EKG   Radiology No results found.  Procedures Procedures (including critical care time)  Medications Ordered in UC Medications - No data to display  Initial Impression / Assessment and Plan / UC Course  I have reviewed the triage vital signs and the nursing notes.  Pertinent labs & imaging results that were available during my care of the patient were reviewed by me and considered in my medical decision making (see chart for details).    History and exam consistent with bronchitis. However, would like to avoid prednisone at this time as patient with unknown control of DM. Will start doxycycline and albuterol at this time. Hycodan for cough as needed. Return precautions given. Patient expresses understanding and agrees to plan.  Final Clinical Impressions(s) / UC Diagnoses   Final diagnoses:  Acute bronchitis, unspecified organism   ED Prescriptions    Medication Sig Dispense Auth. Provider   doxycycline (VIBRAMYCIN) 100 MG capsule  (Status: Discontinued) Take 1 capsule (100 mg total) by mouth 2 (two) times daily. 20 capsule ,  V, PA-C   HYDROcodone-homatropine (HYCODAN) 5-1.5 MG/5ML syrup  (Status: Discontinued) Take 5 mLs by  mouth every 6 (six) hours as needed for cough. 120 mL ,  V, PA-C   albuterol (VENTOLIN HFA) 108 (90 Base) MCG/ACT inhaler  (Status: Discontinued) Inhale 1-2 puffs into the lungs every 6 (six) hours as needed for wheezing or shortness of breath. 6.7 g  Tasia Catchings,  V, PA-C   albuterol (VENTOLIN HFA) 108 (90 Base) MCG/ACT inhaler Inhale 1-2 puffs into the lungs every 6 (six) hours as needed for wheezing or shortness of breath. 6.7 g ,  V, PA-C   doxycycline (VIBRAMYCIN) 100 MG capsule Take 1 capsule (100 mg total) by mouth 2 (two) times daily. 20 capsule ,  V, PA-C   HYDROcodone-homatropine (HYCODAN) 5-1.5 MG/5ML syrup Take 5 mLs by mouth every 6 (six) hours as needed for cough. 120 mL Tasia Catchings,  V, PA-C     I have reviewed the PDMP during this encounter.   Ok Edwards, PA-C 12/11/18 [phone]

## 2019-03-22 ENCOUNTER — Other Ambulatory Visit: Payer: Self-pay

## 2019-03-25 ENCOUNTER — Ambulatory Visit: Payer: 59 | Admitting: Family Medicine

## 2019-03-25 ENCOUNTER — Encounter: Payer: Self-pay | Admitting: Family Medicine

## 2019-03-25 ENCOUNTER — Other Ambulatory Visit: Payer: Self-pay

## 2019-03-25 VITALS — BP 138/80 | HR 100 | Temp 96.7°F | Resp 12 | Ht 67.0 in | Wt 187.5 lb

## 2019-03-25 DIAGNOSIS — E1169 Type 2 diabetes mellitus with other specified complication: Secondary | ICD-10-CM

## 2019-03-25 DIAGNOSIS — E119 Type 2 diabetes mellitus without complications: Secondary | ICD-10-CM

## 2019-03-25 DIAGNOSIS — I10 Essential (primary) hypertension: Secondary | ICD-10-CM

## 2019-03-25 DIAGNOSIS — E785 Hyperlipidemia, unspecified: Secondary | ICD-10-CM | POA: Diagnosis not present

## 2019-03-25 DIAGNOSIS — E1165 Type 2 diabetes mellitus with hyperglycemia: Secondary | ICD-10-CM | POA: Insufficient documentation

## 2019-03-25 HISTORY — DX: Type 2 diabetes mellitus with other specified complication: E11.69

## 2019-03-25 LAB — MICROALBUMIN / CREATININE URINE RATIO
Creatinine,U: 39.3 mg/dL
Microalb Creat Ratio: 1.8 mg/g (ref 0.0–30.0)
Microalb, Ur: <0.7 mg/dL (ref 0.0–1.9)

## 2019-03-25 LAB — COMPREHENSIVE METABOLIC PANEL
ALT: 22 U/L (ref 0–35)
AST: 21 U/L (ref 0–37)
Albumin: 4 g/dL (ref 3.5–5.2)
Alkaline Phosphatase: 95 U/L (ref 39–117)
BUN: 7 mg/dL (ref 6–23)
CO2: 27 mEq/L (ref 19–32)
Calcium: 9.4 mg/dL (ref 8.4–10.5)
Chloride: 100 mEq/L (ref 96–112)
Creatinine, Ser: 0.72 mg/dL (ref 0.40–1.20)
GFR: 101.76 mL/min (ref >60.00)
Glucose, Bld: 215 mg/dL — ABNORMAL HIGH (ref 70–99)
Potassium: 3.9 mEq/L (ref 3.5–5.1)
Sodium: 138 mEq/L (ref 135–145)
Total Bilirubin: 0.4 mg/dL (ref 0.2–1.2)
Total Protein: 7.3 g/dL (ref 6.0–8.3)

## 2019-03-25 LAB — LIPID PANEL
Cholesterol: 170 mg/dL (ref 0–200)
HDL: 66.9 mg/dL (ref >39.00)
LDL Cholesterol: 88 mg/dL (ref 0–99)
NonHDL: 102.6
Total CHOL/HDL Ratio: 3
Triglycerides: 71 mg/dL (ref 0.0–149.0)
VLDL: 14.2 mg/dL (ref 0.0–40.0)

## 2019-03-25 LAB — HEMOGLOBIN A1C: Hgb A1c MFr Bld: 10.5 % — ABNORMAL HIGH (ref 4.6–6.5)

## 2019-03-25 MED ORDER — LISINOPRIL 20 MG PO TABS: 20.0000 mg | ORAL_TABLET | Freq: Every day | ORAL | 2 refills | Status: DC

## 2019-03-25 MED ORDER — HYDROCHLOROTHIAZIDE 25 MG PO TABS: 25.0000 mg | ORAL_TABLET | Freq: Every day | ORAL | 2 refills | Status: DC

## 2019-03-25 NOTE — Assessment & Plan Note (Signed)
Otherwise BP adequately controlled. No changes in current management. We discussed some side effects of antihypertensive medications. Recommend monitoring BP at home. Continue low-salt diet.

## 2019-03-25 NOTE — Progress Notes (Signed)
HPI:   [name] is a 54 y.o. female, who is here today to establish care.  Former PCP: [facility]. Last preventive routine visit: About 1-2 years ago.  Chronic medical problems: DM 2, hypertension, hyperlipidemia, and lower back pain/sciatic among some.  Due to COVID-19 pandemia, she has not followed with former PCP since 2019.  Hypertension: Dx'ed 8-9 years ago. She has been out of medication for about a week. She is not checking BP at home regularly. Currently she is on HCTZ 25 mg daily and lisinopril 20 mg daily. Negative for severe/frequent headache, visual changes, chest pain, dyspnea, palpitation, claudication, focal weakness, or edema.  Hyperlipidemia: Currently she is not on pharmacologic treatment.  DM2: Diagnosed about 3 to 4 years ago. Currently she is on glipizide 2.5 mg daily. She did not tolerate Metformin well in the past.  She is not checking BS frequently, usually 170s-180s. Denies abdominal pain, nausea,vomiting, polydipsia,polyuria, or polyphagia.  Review of Systems  Constitutional: Negative for activity change, appetite change, fatigue and fever.  HENT: Negative for mouth sores, nosebleeds, sore throat and trouble swallowing.   Respiratory: Negative for cough and wheezing.   Gastrointestinal:       Negative for changes in bowel habits.  Genitourinary: Negative for decreased urine volume, dysuria and hematuria.  Musculoskeletal: Positive for back pain. Negative for gait problem.  Neurological: Negative for syncope, facial asymmetry and numbness.  Rest see pertinent positives and negatives per HPI.   Current Outpatient Medications on File Prior to Visit  Medication Sig Dispense Refill  . aspirin EC 81 MG tablet Take 81 mg by mouth daily.    Marland Kitchen glipiZIDE (GLUCOTROL XL) 2.5 MG 24 hr tablet Take 1 tablet (2.5 mg total) by mouth daily with breakfast. 90 tablet 0  . Multiple Vitamins-Minerals  (MULTIVITAMIN ADULT PO) Take 1 capsule by mouth daily.     No current facility-administered medications on file prior to visit.   Past Medical History:  Diagnosis Date  . Allergy    seasonal  . Cancer (Jenna Myers) MOTHER   Jenna Myers  . Constipation    prune juice and probiotic helps  . Diabetes mellitus without complication (HCC)    questionable-Hgb A1C down to 6.7  . Heart murmur 2010  . Hypertension   . Stroke (Jenna Myers) FATHER   Jenna Myers   Allergies  Allergen Reactions  . Penicillin G Itching and Rash    Family History  Problem Relation Age of Onset  . Heart disease Father   . Diabetes Father   . Hypertension Father   . Stroke Father   . Colon cancer Neg Hx     Social History   Socioeconomic History  . Marital status: Single    Spouse name: Not on file  . Number of children: Not on file  . Years of education: Not on file  . Highest education level: Not on file  Occupational History  . Not on file  Tobacco Use  . Smoking status: Never Smoker  . Smokeless tobacco: Never Used  Substance and Sexual Activity  . Alcohol use: No    Alcohol/week: 0.0 standard drinks  . Drug use: No  . Sexual activity: Not Currently    Birth control/protection: Abstinence, Other-see comments  Other Topics Concern  . Not on file  Social History Narrative  . Not on file   Social Determinants of Health   Financial Resource Strain:   . Difficulty of Paying Living Expenses: Not  on file  Food Insecurity:   . Worried About Charity fundraiser in the Last Year: Not on file  . Ran Out of Food in the Last Year: Not on file  Transportation Needs:   . Lack of Transportation (Medical): Not on file  . Lack of Transportation (Non-Medical): Not on file  Physical Activity:   . Days of Exercise per Week: Not on file  . Minutes of Exercise per Session: Not on file  Stress:   . Feeling of Stress : Not on file  Social Connections:   . Frequency of Communication with Friends and Family: Not on file    . Frequency of Social Gatherings with Friends and Family: Not on file  . Attends Religious Services: Not on file  . Active Member of Clubs or Organizations: Not on file  . Attends Archivist Meetings: Not on file  . Marital Status: Not on file    Vitals:   03/25/19 0727 03/25/19 0801  BP: 138/80   Pulse: (!) 110 100  Resp: 12   Temp: (!) 96.7 F (35.9 C)   SpO2: 98%    Body mass index is 29.37 kg/m.  Physical Exam  Nursing note and vitals reviewed. Constitutional: She is oriented to person, place, and time. She appears well-developed. No distress.  HENT:  Head: Normocephalic and atraumatic.  Mouth/Throat: Oropharynx is clear and moist and mucous membranes are normal.  Eyes: Pupils are equal, round, and reactive to light. Conjunctivae are normal.  Cardiovascular: Normal rate and regular rhythm.  No murmur heard. Pulses:      Dorsalis pedis pulses are 2+ on the right side and 2+ on the left side.  Respiratory: Effort normal and breath sounds normal. No respiratory distress.  GI: Soft. She exhibits no mass. There is no hepatomegaly. There is no abdominal tenderness.  Musculoskeletal:        General: No edema.  Lymphadenopathy:    She has no cervical adenopathy.  Neurological: She is alert and oriented to person, place, and time. She has normal strength. No cranial nerve deficit. Gait normal.  Skin: Skin is warm. No rash noted. No erythema.  Psychiatric: She has a normal mood and affect.  Well groomed, good eye contact.   Diabetic Foot Exam - Simple   Simple Foot Form Diabetic Foot exam was performed with the following findings: Yes 03/25/2019  8:03 AM  Visual Inspection No deformities, no ulcerations, no other skin breakdown bilaterally: Yes Sensation Testing Intact to touch and monofilament testing bilaterally: Yes Pulse Check Posterior Tibialis and Dorsalis pulse intact bilaterally: Yes Comments    ASSESSMENT AND PLAN:  [name] was seen today for  establish care.  Diagnoses and all orders for this visit: Orders Placed This Encounter  Procedures  . Comprehensive metabolic panel  . Fructosamine  . Hemoglobin A1c  . Lipid panel  . Microalbumin / creatinine urine ratio    Lab Results  Component Value Date   HGBA1C 10.5 (H) 03/25/2019   Lab Results  Component Value Date   CREATININE 0.72 03/25/2019   BUN 7 03/25/2019   NA 138 03/25/2019   K 3.9 03/25/2019   CL 100 03/25/2019   CO2 27 03/25/2019   Lab Results  Component Value Date   CHOL 170 03/25/2019   HDL 66.90 03/25/2019   LDLCALC 88 03/25/2019   TRIG 71.0 03/25/2019   CHOLHDL 3 03/25/2019   Lab Results  Component Value Date   MICROALBUR <0.7 03/25/2019  Lab Results  Component Value Date   ALT 22 03/25/2019   AST 21 03/25/2019   ALKPHOS 95 03/25/2019   BILITOT 0.4 03/25/2019    Benign essential hypertension Otherwise BP adequately controlled. No changes in current management. We discussed some side effects of antihypertensive medications. Recommend monitoring BP at home. Continue low-salt diet.  Hyperlipidemia associated with type 2 diabetes mellitus ([place]) Based on lipid panel results I will make recommendations in regard to statin medication and dose. We discussed benefits of statins.   Diabetes mellitus type 2, uncomplicated (HCC) We discussed side effects of glipizide. I would like to change medication, so we will make recommendations according to A1c. Annual eye exam, periodic dental and foot care recommended.  Eye exam is due.   Return in about 6 months (around 09/25/2019) for 5-6 months..    G. [place], MD  [facility]. [place] office.

## 2019-03-25 NOTE — Assessment & Plan Note (Signed)
We discussed side effects of glipizide. I would like to change medication, so we will make recommendations according to A1c. Annual eye exam, periodic dental and foot care recommended.  Eye exam is due.

## 2019-03-25 NOTE — Assessment & Plan Note (Signed)
Based on lipid panel results I will make recommendations in regard to statin medication and dose. We discussed benefits of statins.

## 2019-03-25 NOTE — Patient Instructions (Addendum)
A few things to remember from today's visit:   Benign essential hypertension - Plan: Comprehensive metabolic panel, lisinopril (ZESTRIL) 20 MG tablet, hydrochlorothiazide (HYDRODIURIL) 25 MG tablet  Hyperlipidemia associated with type 2 diabetes mellitus ([name]) - Plan: Comprehensive metabolic panel, Lipid panel  Type 2 diabetes mellitus without complication, without long-term current use of insulin (HCC) - Plan: Comprehensive metabolic panel, Fructosamine, Hemoglobin A1c, Microalbumin / creatinine urine ratio  I will change diabetes medication , waiting for results of A1C. No changes in blood pressure medication.  Please be sure medication list is accurate. If a new problem present, please set up appointment sooner than planned today.

## 2019-03-26 ENCOUNTER — Encounter: Payer: Self-pay | Admitting: Family Medicine

## 2019-03-26 ENCOUNTER — Telehealth: Payer: Self-pay | Admitting: Family Medicine

## 2019-03-26 NOTE — Telephone Encounter (Signed)
Renal function and liver test in normal range.  A1c 10.5, I am not sure oral medication will bring A1c to goal (<7.0). I think she tried Metformin in the past and did not tolerate it well, sometimes this medication is better tolerated when in combination with another hypoglycemic agent.  If she agrees, she can try a combination of empagliflozin and Metformin. Another option is to start basal insulin, Basaglar 15 units daily. Diabetes education can also be arranged.  Cholesterol is in normal range.  Because DM II,she needs to be on a statin medication, low dose. Pravastatin 10 mg daily.  Thanks, BJ

## 2019-03-26 NOTE — Telephone Encounter (Signed)
Pt is calling back to follow up on her lab results from yesterday. Thanks

## 2019-03-27 NOTE — Telephone Encounter (Signed)
I left pt a voicemail to return my call.

## 2019-03-27 NOTE — Telephone Encounter (Signed)
I spoke with pt. We went over her results. She does not want to try anything with metformin in it. Agreed to try the Jardiance, which dosage do you want to start with? She also agreed to trying the cholesterol medication.

## 2019-03-28 LAB — FRUCTOSAMINE: Fructosamine: 412 umol/L — ABNORMAL HIGH (ref 205–285)

## 2019-03-29 ENCOUNTER — Other Ambulatory Visit: Payer: Self-pay | Admitting: Family Medicine

## 2019-03-29 DIAGNOSIS — E119 Type 2 diabetes mellitus without complications: Secondary | ICD-10-CM

## 2019-03-29 MED ORDER — GLIPIZIDE ER 5 MG PO TB24
5.0000 mg | ORAL_TABLET | Freq: Every day | ORAL | 1 refills | Status: DC
Start: 2019-03-29 — End: 2019-12-11

## 2019-03-29 NOTE — Telephone Encounter (Signed)
See mychart messages

## 2019-11-06 ENCOUNTER — Other Ambulatory Visit: Payer: Self-pay | Admitting: Family Medicine

## 2019-11-06 DIAGNOSIS — Z1231 Encounter for screening mammogram for malignant neoplasm of breast: Secondary | ICD-10-CM

## 2019-12-05 ENCOUNTER — Ambulatory Visit
Admission: RE | Admit: 2019-12-05 | Discharge: 2019-12-05 | Disposition: A | Discharge status: Home / Self Care | Payer: 59 | Source: Ambulatory Visit | Attending: Family Medicine | Admitting: Family Medicine

## 2019-12-05 ENCOUNTER — Other Ambulatory Visit: Payer: Self-pay

## 2019-12-05 DIAGNOSIS — Z1231 Encounter for screening mammogram for malignant neoplasm of breast: Secondary | ICD-10-CM

## 2019-12-11 ENCOUNTER — Encounter: Payer: Self-pay | Admitting: Family Medicine

## 2019-12-11 DIAGNOSIS — E119 Type 2 diabetes mellitus without complications: Secondary | ICD-10-CM

## 2019-12-11 DIAGNOSIS — I10 Essential (primary) hypertension: Secondary | ICD-10-CM

## 2019-12-11 MED ORDER — GLIPIZIDE ER 5 MG PO TB24: 5.0000 mg | ORAL_TABLET | Freq: Every day | ORAL | 12 refills | Status: DC

## 2019-12-11 MED ORDER — HYDROCHLOROTHIAZIDE 25 MG PO TABS: 25.0000 mg | ORAL_TABLET | Freq: Every day | ORAL | 12 refills | Status: DC

## 2019-12-11 MED ORDER — LISINOPRIL 20 MG PO TABS: 20.0000 mg | ORAL_TABLET | Freq: Every day | ORAL | 12 refills | Status: DC

## 2020-11-26 ENCOUNTER — Encounter: Payer: 59 | Admitting: Obstetrics & Gynecology

## 2020-12-18 ENCOUNTER — Other Ambulatory Visit: Payer: Self-pay | Admitting: Family Medicine

## 2020-12-18 DIAGNOSIS — I10 Essential (primary) hypertension: Secondary | ICD-10-CM

## 2021-01-12 ENCOUNTER — Encounter: Payer: Self-pay | Admitting: Obstetrics & Gynecology

## 2021-01-12 ENCOUNTER — Other Ambulatory Visit (HOSPITAL_COMMUNITY)
Admission: RE | Admit: 2021-01-12 | Discharge: 2021-01-12 | Disposition: A | Discharge status: Home / Self Care | Payer: 59 | Source: Ambulatory Visit | Attending: Obstetrics & Gynecology | Admitting: Obstetrics & Gynecology

## 2021-01-12 ENCOUNTER — Ambulatory Visit: Payer: 59 | Admitting: Obstetrics & Gynecology

## 2021-01-12 ENCOUNTER — Other Ambulatory Visit: Payer: Self-pay

## 2021-01-12 VITALS — BP 138/94 | HR 83 | Wt 181.0 lb

## 2021-01-12 DIAGNOSIS — Z1231 Encounter for screening mammogram for malignant neoplasm of breast: Secondary | ICD-10-CM

## 2021-01-12 DIAGNOSIS — N760 Acute vaginitis: Secondary | ICD-10-CM | POA: Diagnosis not present

## 2021-01-12 DIAGNOSIS — Z01419 Encounter for gynecological examination (general) (routine) without abnormal findings: Secondary | ICD-10-CM | POA: Diagnosis not present

## 2021-01-12 MED ORDER — FLUCONAZOLE 150 MG PO TABS: 150.0000 mg | ORAL_TABLET | Freq: Once | ORAL | 3 refills | Status: AC

## 2021-01-12 MED ORDER — NYSTATIN-TRIAMCINOLONE 100000-0.1 UNIT/GM-% EX CREA: 1.0000 "application " | TOPICAL_CREAM | Freq: Two times a day (BID) | CUTANEOUS | 1 refills | Status: DC

## 2021-01-12 NOTE — Progress Notes (Signed)
GYNECOLOGY ANNUAL PREVENTATIVE CARE ENCOUNTER NOTE  History:     Jenna Myers is a 55 y.o. PMP female here for a routine annual gynecologic exam.  Current complaints: none.  Reports having alternating light and heavy periods, but still regularly menstruating.   Denies abnormal vaginal discharge, pelvic pain, problems with intercourse or other gynecologic concerns.    Gynecologic History Patient's last menstrual period was 11/13/2020 (exact date). Contraception: abstinence Last Pap: About 3-4 years ago. Result was normal as per patient. Last Mammogram: 12/05/2019.  Result was normal Last Colonoscopy: 05/26/2014.  Result was normal  Past Medical History:  Diagnosis Date   Allergy    seasonal   Cancer ([place]) MOTHER   WILLETTE   Constipation    prune juice and probiotic helps   Diabetes mellitus without complication (HCC)    questionable-Hgb A1C down to 6.7   Heart murmur 2010   Hypertension    Stroke ([place]) FATHER   MITCHELL    Past Surgical History:  Procedure Laterality Date   CORONARY ARTERY BYPASS GRAFT  Father   Alroy Dust   VAGINAL DELIVERY     x1    Current Outpatient Medications on File Prior to Visit  Medication Sig Dispense Refill   aspirin EC 81 MG tablet Take 81 mg by mouth daily.     glipiZIDE (GLUCOTROL XL) 5 MG 24 hr tablet Take 1 tablet (5 mg total) by mouth daily with breakfast. 30 tablet 12   hydrochlorothiazide (HYDRODIURIL) 25 MG tablet Take 1 tablet by mouth once daily 30 tablet 0   lisinopril (ZESTRIL) 20 MG tablet Take 1 tablet by mouth once daily 30 tablet 0   Multiple Vitamins-Minerals (MULTIVITAMIN ADULT PO) Take 1 capsule by mouth daily.     No current facility-administered medications on file prior to visit.    Allergies  Allergen Reactions   Penicillin G Itching and Rash    Social History:  reports that she has never smoked. She has never used smokeless tobacco. She reports that she does not drink alcohol and does not use  drugs.  Family History  Problem Relation Age of Onset   Heart disease Father    Diabetes Father    Hypertension Father    Stroke Father    Colon cancer Neg Hx     The following portions of the patient's history were reviewed and updated as appropriate: allergies, current medications, past family history, past medical history, past social history, past surgical history and problem list.  Review of Systems Pertinent items noted in HPI and remainder of comprehensive ROS otherwise negative.  Physical Exam:  BP (!) 138/94    Pulse 83    Wt 181 lb (82.1 kg)    LMP 11/13/2020 (Exact Date)    BMI 28.35 kg/m  CONSTITUTIONAL: Well-developed, well-nourished female in no acute distress.  HENT:  Normocephalic, atraumatic, External right and left ear normal.  EYES: Conjunctivae and EOM are normal. Pupils are equal, round, and reactive to light. No scleral icterus.  NECK: Normal range of motion, supple, no masses.  Normal thyroid.  SKIN: Skin is warm and dry. No rash noted. Not diaphoretic. No erythema. No pallor. MUSCULOSKELETAL: Normal range of motion. No tenderness.  No cyanosis, clubbing, or edema. NEUROLOGIC: Alert and oriented to person, place, and time. Normal reflexes, muscle tone coordination.  PSYCHIATRIC: Normal mood and affect. Normal behavior. Normal judgment and thought content. CARDIOVASCULAR: Normal heart rate noted, regular rhythm RESPIRATORY: Clear to auscultation bilaterally. Effort and breath sounds  normal, no problems with respiration noted. BREASTS: Symmetric in size. No masses, tenderness, skin changes, nipple drainage, or lymphadenopathy bilaterally. Performed in the presence of a chaperone. ABDOMEN: Soft, no distention noted.  No tenderness, rebound or guarding.  PELVIC: Diffuse erythema and excoriation noted on bilateral labia, no lesions. Normal urethral meatus; normal appearing vaginal mucosa and cervix.  Scant white vaginal discharge noted, testing sample obtained.  Pap  smear obtained.  Normal uterine size, no other palpable masses, no uterine or adnexal tenderness.  Performed in the presence of a chaperone.   Assessment and Plan:    1. Vulvovaginitis Concerned about yeast vulvovaginitis, but will follow up labs results. Presumptively treated for yeast infection with Diflucan and Mycolog.  - Cervicovaginal ancillary only - fluconazole (DIFLUCAN) 150 MG tablet; Take 1 tablet (150 mg total) by mouth once for 1 dose. Can take additional dose three days later if symptoms persist  Dispense: 1 tablet; Refill: 3 - nystatin-triamcinolone (MYCOLOG II) cream; Apply 1 application topically 2 (two) times daily. Apply to affected area  Dispense: 30 g; Refill: 1  2. Breast cancer screening by mammogram She is up to date.  Next mammogram ordered, patient wants to schedule herself on MyChart.  - MM 3D SCREEN BREAST BILATERAL; Future  3. Well woman exam with routine gynecological exam - Cytology - PAP Will follow up results of pap smear and manage accordingly. Colon cancer screening is up to date. Discussed perimenopause in detail, all questions answered. Routine preventative health maintenance measures emphasized. Please refer to After Visit Summary for other counseling recommendations.      Verita Schneiders, MD, [place] for [employer], [place]

## 2021-01-13 LAB — CERVICOVAGINAL ANCILLARY ONLY
Bacterial Vaginitis (gardnerella): NEGATIVE
Candida Glabrata: NEGATIVE
Candida Vaginitis: POSITIVE — AB
Comment: NEGATIVE
Comment: NEGATIVE
Comment: NEGATIVE

## 2021-01-19 LAB — CYTOLOGY - PAP
Comment: NEGATIVE
Diagnosis: NEGATIVE
Diagnosis: REACTIVE
High risk HPV: NEGATIVE

## 2021-02-01 ENCOUNTER — Telehealth: Payer: Self-pay | Admitting: Family Medicine

## 2021-02-01 NOTE — Telephone Encounter (Signed)
Patient calling in with respiratory symptoms: Shortness of breath, chest pain, palpitations or other red words send to Triage  Does the patient have a fever over 100, cough, congestion, sore throat, runny nose, lost of taste/smell (please list symptoms that patient has)? On and off coughing, sneezing, congestion. Patient takes OTC medication such as mucinex and it gets better then comes back in a couple days  What date did symptoms start?12/24 (If over 5 days ago, pt may be scheduled for in person visit)  Have you tested for Covid in the last 5 days? No  Took covid test in December, was negative  If yes, was it positive OR negative ? If positive in the last 5 days, please schedule virtual visit now. If negative, schedule for an in person OV with the next available provider if PCP has no openings. Please also let patient know they will be tested again (follow the script below)  "you will have to arrive 35mins prior to your appt time to be Covid tested. Please park in back of office at the cone & call [phone] to let the staff know you have arrived. A staff member will meet you at your car to do a rapid covid test. Once the test has resulted you will be notified by phone of your results to determine if appt will remain an in person visit or be converted to a virtual/phone visit. If you arrive less than 54mins before your appt time, your visit will be automatically converted to virtual & any recommended testing will happen AFTER the visit."   [name]  If no availability for virtual visit in office,  please schedule another [place] office  If no availability at another [place] office, please instruct patient that they can schedule an evisit or virtual visit through their mychart account. Visits up to 8pm  patients can be seen in office 5 days after positive COVID test

## 2021-02-03 ENCOUNTER — Ambulatory Visit: Payer: 59 | Admitting: Family Medicine

## 2021-02-03 VITALS — BP 190/120 | HR 90 | Temp 98.5°F | Wt 186.6 lb

## 2021-02-03 DIAGNOSIS — I10 Essential (primary) hypertension: Secondary | ICD-10-CM | POA: Diagnosis not present

## 2021-02-03 DIAGNOSIS — R051 Acute cough: Secondary | ICD-10-CM

## 2021-02-03 MED ORDER — LISINOPRIL 20 MG PO TABS: 20.0000 mg | ORAL_TABLET | Freq: Every day | ORAL | 3 refills | Status: DC

## 2021-02-03 MED ORDER — HYDROCHLOROTHIAZIDE 25 MG PO TABS: 25.0000 mg | ORAL_TABLET | Freq: Every day | ORAL | 3 refills | Status: DC

## 2021-02-03 MED ORDER — DOXYCYCLINE HYCLATE 100 MG PO CAPS: 100.0000 mg | ORAL_CAPSULE | Freq: Two times a day (BID) | ORAL | 0 refills | Status: DC

## 2021-02-03 NOTE — Progress Notes (Signed)
Established Patient Office Visit  Subjective:  Patient ID: Jenna Myers, female    DOB: 12-19-1965  Age: 56 y.o. MRN: [id number]  CC:  Chief Complaint  Patient presents with   Cough    Productive cough, congestion, green mucus    HPI Jenna Myers presents for increasing productive cough.  She states she developed respiratory symptoms around Christmas eve.  She took some over-the-counter medications and felt better but then felt worse recently.  No fever.  Cough productive of thick green mucus.  No documented fever.  No dyspnea.  No obvious wheezing.  Non-smoker.  She has history of hypertension and type 2 diabetes.  She ran out of her blood pressure medications on Saturday and unable to get those refilled.  Her blood pressure is extremely high today.  She takes HCTZ and lisinopril at baseline.  She is on glipizide for type 2 diabetes.  Denies any recent headache.  No dizziness.  No chest pain.  Past Medical History:  Diagnosis Date   Allergy    seasonal   Cancer ([place]) MOTHER   WILLETTE   Constipation    prune juice and probiotic helps   Diabetes mellitus without complication (HCC)    questionable-Hgb A1C down to 6.7   Heart murmur 2010   Hypertension    Stroke ([place]) FATHER   MITCHELL    Past Surgical History:  Procedure Laterality Date   CORONARY ARTERY BYPASS GRAFT  Father   Alroy Dust   VAGINAL DELIVERY     x1    Family History  Problem Relation Age of Onset   Heart disease Father    Diabetes Father    Hypertension Father    Stroke Father    Colon cancer Neg Hx     Social History   Socioeconomic History   Marital status: Single    Spouse name: Not on file   Number of children: Not on file   Years of education: Not on file   Highest education level: Bachelor's degree (e.g., BA, AB, BS)  Occupational History   Not on file  Tobacco Use   Smoking status: Never   Smokeless tobacco: Never  Substance and Sexual Activity   Alcohol use: No     Alcohol/week: 0.0 standard drinks   Drug use: No   Sexual activity: Not Currently    Birth control/protection: Abstinence, Other-see comments  Other Topics Concern   Not on file  Social History Narrative   Not on file   Social Determinants of Health   Financial Resource Strain: Low Risk    Difficulty of Paying Living Expenses: Not very hard  Food Insecurity: Food Insecurity Present   Worried About Running Out of Food in the Last Year: Sometimes true   Ran Out of Food in the Last Year: Never true  Transportation Needs: No Transportation Needs   Lack of Transportation (Medical): No   Lack of Transportation (Non-Medical): No  Physical Activity: Sufficiently Active   Days of Exercise per Week: 3 days   Minutes of Exercise per Session: 60 min  Stress: No Stress Concern Present   Feeling of Stress : Not at all  Social Connections: Moderately Integrated   Frequency of Communication with Friends and Family: More than three times a week   Frequency of Social Gatherings with Friends and Family: Three times a week   Attends Religious Services: More than 4 times per year   Active Member of Clubs or Organizations: Yes   Attends Club or  Organization Meetings: 1 to 4 times per year   Marital Status: Never married  Intimate Partner Violence: Not on file    Outpatient Medications Prior to Visit  Medication Sig Dispense Refill   aspirin EC 81 MG tablet Take 81 mg by mouth daily.     glipiZIDE (GLUCOTROL XL) 5 MG 24 hr tablet Take 1 tablet (5 mg total) by mouth daily with breakfast. 30 tablet 12   Multiple Vitamins-Minerals (MULTIVITAMIN ADULT PO) Take 1 capsule by mouth daily.     nystatin-triamcinolone (MYCOLOG II) cream Apply 1 application topically 2 (two) times daily. Apply to affected area 30 g 1   hydrochlorothiazide (HYDRODIURIL) 25 MG tablet Take 1 tablet by mouth once daily 30 tablet 0   lisinopril (ZESTRIL) 20 MG tablet Take 1 tablet by mouth once daily 30 tablet 0   No  facility-administered medications prior to visit.    Allergies  Allergen Reactions   Penicillin G Itching and Rash    ROS Review of Systems  Constitutional:  Negative for chills, fatigue and fever.  Eyes:  Negative for visual disturbance.  Respiratory:  Positive for cough. Negative for chest tightness, shortness of breath and wheezing.   Cardiovascular:  Negative for chest pain, palpitations and leg swelling.  Neurological:  Negative for dizziness, seizures, syncope, weakness, light-headedness and headaches.     Objective:    Physical Exam Constitutional:      Appearance: She is well-developed.  HENT:     Right Ear: Tympanic membrane normal.     Left Ear: Tympanic membrane normal.     Mouth/Throat:     Mouth: Mucous membranes are moist.     Pharynx: Oropharynx is clear.  Eyes:     Pupils: Pupils are equal, round, and reactive to light.  Neck:     Thyroid: No thyromegaly.     Vascular: No JVD.  Cardiovascular:     Rate and Rhythm: Normal rate and regular rhythm.     Heart sounds:    No gallop.  Pulmonary:     Effort: Pulmonary effort is normal. No respiratory distress.     Breath sounds: Normal breath sounds. No wheezing or rales.  Musculoskeletal:     Cervical back: Neck supple.  Neurological:     Mental Status: She is alert.    BP (!) 190/120 (BP Location: Left Arm, Patient Position: Sitting, Cuff Size: Normal)    Pulse 90    Temp 98.5 F (36.9 C) (Oral)    Wt 186 lb 9.6 oz (84.6 kg)    SpO2 99%    BMI 29.23 kg/m  Wt Readings from Last 3 Encounters:  02/03/21 186 lb 9.6 oz (84.6 kg)  01/12/21 181 lb (82.1 kg)  03/25/19 187 lb 8 oz (85 kg)     Health Maintenance Due  Topic Date Due   COVID-19 Vaccine (1) Never done   Pneumococcal Vaccine 30-53 Years old (1 - PCV) Never done   OPHTHALMOLOGY EXAM  Never done   Zoster Vaccines- Shingrix (1 of 2) Never done   TETANUS/TDAP  04/17/2018   HEMOGLOBIN A1C  09/25/2019   FOOT EXAM  03/24/2020   INFLUENZA VACCINE   Never done    There are no preventive care reminders to display for this patient.  No results found for: TSH Lab Results  Component Value Date   WBC 6.9 04/16/2008   HGB 12.1 04/16/2008   HCT 36.6 04/16/2008   MCV 86.7 04/16/2008   PLT 389 04/16/2008   Lab  Results  Component Value Date   NA 138 03/25/2019   K 3.9 03/25/2019   CO2 27 03/25/2019   GLUCOSE 215 (H) 03/25/2019   BUN 7 03/25/2019   CREATININE 0.72 03/25/2019   BILITOT 0.4 03/25/2019   ALKPHOS 95 03/25/2019   AST 21 03/25/2019   ALT 22 03/25/2019   PROT 7.3 03/25/2019   ALBUMIN 4.0 03/25/2019   CALCIUM 9.4 03/25/2019   GFR 101.76 03/25/2019   Lab Results  Component Value Date   CHOL 170 03/25/2019   Lab Results  Component Value Date   HDL 66.90 03/25/2019   Lab Results  Component Value Date   LDLCALC 88 03/25/2019   Lab Results  Component Value Date   TRIG 71.0 03/25/2019   Lab Results  Component Value Date   CHOLHDL 3 03/25/2019   Lab Results  Component Value Date   HGBA1C 10.5 (H) 03/25/2019      Assessment & Plan:   #1 productive cough.  Increasingly productive of green mucus past several days.  She has had worsening of symptoms after initial improvement.  We decided to go and cover with doxycycline 100 mg twice daily for 7 days.  Consider over-the-counter plain Mucinex twice daily.  Plenty of fluids and rest and be in touch if symptoms not improving over the next week  #2 severe hypertension.  Out of medications for the past several days and now presents with severe blood pressure elevation.  Refilled her HCTZ and lisinopril.  We strongly recommended she schedule follow-up with her primary within the next couple weeks to reassess.   Meds ordered this encounter  Medications   hydrochlorothiazide (HYDRODIURIL) 25 MG tablet    Sig: Take 1 tablet (25 mg total) by mouth daily.    Dispense:  90 tablet    Refill:  3    Due for physical.   lisinopril (ZESTRIL) 20 MG tablet    Sig: Take 1  tablet (20 mg total) by mouth daily.    Dispense:  90 tablet    Refill:  3    Due for physical   doxycycline (VIBRAMYCIN) 100 MG capsule    Sig: Take 1 capsule (100 mg total) by mouth 2 (two) times daily.    Dispense:  14 capsule    Refill:  0    Follow-up: No follow-ups on file.    Carolann Littler, MD

## 2021-02-03 NOTE — Patient Instructions (Signed)
Set up follow up with Dr Martinique

## 2021-03-01 ENCOUNTER — Ambulatory Visit
Admission: RE | Admit: 2021-03-01 | Discharge: 2021-03-01 | Disposition: A | Discharge status: Home / Self Care | Payer: 59 | Source: Ambulatory Visit | Attending: Obstetrics & Gynecology | Admitting: Obstetrics & Gynecology

## 2021-03-01 DIAGNOSIS — Z1231 Encounter for screening mammogram for malignant neoplasm of breast: Secondary | ICD-10-CM

## 2021-04-26 NOTE — Progress Notes (Deleted)
? ? ?HPI: ?Jenna Myers is a 56 y.o. female, who is here today for her routine physical. ? ?Last CPE: 2021 ? ?Regular exercise 3 or more time per week: *** ?Following a healthy diet: *** ?She lives with *** ? ?Chronic medical problems: *** ? ?Immunization History  ?Administered Date(s) Administered  ?? Td 04/16/2008  ? ?Health Maintenance  ?Topic Date Due  ?? COVID-19 Vaccine (1) Never done  ?? OPHTHALMOLOGY EXAM  Never done  ?? Zoster Vaccines- Shingrix (1 of 2) Never done  ?? TETANUS/TDAP  04/17/2018  ?? HEMOGLOBIN A1C  09/25/2019  ?? FOOT EXAM  03/24/2020  ?? INFLUENZA VACCINE  08/24/2021  ?? MAMMOGRAM  03/02/2023  ?? PAP SMEAR-Modifier  01/13/2024  ?? COLONOSCOPY (Pts 45-16yr Insurance coverage will need to be confirmed)  05/25/2024  ?? HPV VACCINES  Aged Out  ?? Hepatitis C Screening  Discontinued  ?? HIV Screening  Discontinued  ? ? ?She has *** concerns today. ? ?Diabetes Mellitus II:  ?- Checking BG at home: *** ?- Medications: Glipizide 5 mg daily. ?- Compliance: *** ?- Diet: *** ?- Exercise: *** ?- eye exam: *** ?- foot exam: *** ?- Negative for symptoms of hypoglycemia, polyuria, polydipsia, numbness extremities, foot ulcers/trauma ? ?Lab Results  ?Component Value Date  ? HGBA1C 10.5 (H) 03/25/2019  ? ?Lab Results  ?Component Value Date  ? MICROALBUR <0.7 03/25/2019  ? ?Hypertension:  ?Medications: Lisinopril 20 mg daily and HCTZ 25 mg daily. ?BP readings at home:*** ?Side effects:none ? ?Negative for unusual or severe headache, visual changes, exertional chest pain, dyspnea,  focal weakness, or edema. ? ?Lab Results  ?Component Value Date  ? CREATININE 0.72 03/25/2019  ? BUN 7 03/25/2019  ? NA 138 03/25/2019  ? K 3.9 03/25/2019  ? CL 100 03/25/2019  ? CO2 27 03/25/2019  ? ? ? ? ?Review of Systems  ?Constitutional: Negative.   ?HENT: Negative.    ?Eyes: Negative.   ?Respiratory: Negative.    ?Cardiovascular: Negative.   ?Gastrointestinal: Negative.   ?Endocrine: Negative.   ?Genitourinary:  Negative.   ?Musculoskeletal: Negative.   ?Skin: Negative.   ?Allergic/Immunologic: Negative.   ?Neurological: Negative.   ?Hematological: Negative.   ?Psychiatric/Behavioral: Negative.    ? ?Current Outpatient Medications on File Prior to Visit  ?Medication Sig Dispense Refill  ?? aspirin EC 81 MG tablet Take 81 mg by mouth daily.    ?? doxycycline (VIBRAMYCIN) 100 MG capsule Take 1 capsule (100 mg total) by mouth 2 (two) times daily. 14 capsule 0  ?? glipiZIDE (GLUCOTROL XL) 5 MG 24 hr tablet Take 1 tablet (5 mg total) by mouth daily with breakfast. 30 tablet 12  ?? hydrochlorothiazide (HYDRODIURIL) 25 MG tablet Take 1 tablet (25 mg total) by mouth daily. 90 tablet 3  ?? lisinopril (ZESTRIL) 20 MG tablet Take 1 tablet (20 mg total) by mouth daily. 90 tablet 3  ?? Multiple Vitamins-Minerals (MULTIVITAMIN ADULT PO) Take 1 capsule by mouth daily.    ?? nystatin-triamcinolone (MYCOLOG II) cream Apply 1 application topically 2 (two) times daily. Apply to affected area 30 g 1  ? ?No current facility-administered medications on file prior to visit.  ? ? ? ?Past Medical History:  ?Diagnosis Date  ?? Allergy   ? seasonal  ?? Cancer ([facility] MOTHER  ? WILLETTE  ?? Constipation   ? prune juice and probiotic helps  ?? Diabetes mellitus without complication ([place]   ? questionable-Hgb A1C down to 6.7  ?? Heart murmur 2010  ??  Hypertension   ?? Stroke [facility]) FATHER  ? MITCHELL  ? ? ?Past Surgical History:  ?Procedure Laterality Date  ?? CORONARY ARTERY BYPASS GRAFT  Father  ? Alroy Dust  ?? VAGINAL DELIVERY    ? x1  ? ? ?Allergies  ?Allergen Reactions  ?? Penicillin G Itching and Rash  ? ? ?Family History  ?Problem Relation Age of Onset  ?? Heart disease Father   ?? Diabetes Father   ?? Hypertension Father   ?? Stroke Father   ?? Colon cancer Neg Hx   ? ? ?Social History  ? ?Socioeconomic History  ?? Marital status: Single  ?  Spouse name: Not on file  ?? Number of children: Not on file  ?? Years of education: Not on file  ?? Highest  education level: Bachelor's degree (e.g., BA, AB, BS)  ?Occupational History  ?? Not on file  ?Tobacco Use  ?? Smoking status: Never  ?? Smokeless tobacco: Never  ?Substance and Sexual Activity  ?? Alcohol use: No  ?  Alcohol/week: 0.0 standard drinks  ?? Drug use: No  ?? Sexual activity: Not Currently  ?  Birth control/protection: Abstinence, Other-see comments  ?Other Topics Concern  ?? Not on file  ?Social History Narrative  ?? Not on file  ? ?Social Determinants of Health  ? ?Financial Resource Strain: Low Risk   ?? Difficulty of Paying Living Expenses: Not very hard  ?Food Insecurity: Food Insecurity Present  ?? Worried About Charity fundraiser in the Last Year: Sometimes true  ?? Ran Out of Food in the Last Year: Never true  ?Transportation Needs: No Transportation Needs  ?? Lack of Transportation (Medical): No  ?? Lack of Transportation (Non-Medical): No  ?Physical Activity: Sufficiently Active  ?? Days of Exercise per Week: 3 days  ?? Minutes of Exercise per Session: 60 min  ?Stress: No Stress Concern Present  ?? Feeling of Stress : Not at all  ?Social Connections: Moderately Integrated  ?? Frequency of Communication with Friends and Family: More than three times a week  ?? Frequency of Social Gatherings with Friends and Family: Three times a week  ?? Attends Religious Services: More than 4 times per year  ?? Active Member of Clubs or Organizations: Yes  ?? Attends Archivist Meetings: 1 to 4 times per year  ?? Marital Status: Never married  ? ? ?There were no vitals filed for this visit. ?There is no height or weight on file to calculate BMI. ? ?Wt Readings from Last 3 Encounters:  ?02/03/21 186 lb 9.6 oz (84.6 kg)  ?01/12/21 181 lb (82.1 kg)  ?03/25/19 187 lb 8 oz (85 kg)  ? ? ? ?Physical Exam ?Vitals and nursing note reviewed.  ?Constitutional:   ?   Appearance: Normal appearance. She is normal weight.  ?HENT:  ?   Head: Normocephalic and atraumatic.  ?   Right Ear: Tympanic membrane, ear canal  and external ear normal.  ?   Left Ear: Tympanic membrane, ear canal and external ear normal.  ?   Nose: Nose normal.  ?   Mouth/Throat:  ?   Mouth: Mucous membranes are moist.  ?   Pharynx: Oropharynx is clear.  ?Eyes:  ?   Extraocular Movements: Extraocular movements intact.  ?   Conjunctiva/sclera: Conjunctivae normal.  ?   Pupils: Pupils are equal, round, and reactive to light.  ?Cardiovascular:  ?   Rate and Rhythm: Normal rate and regular rhythm.  ?   Pulses: Normal  pulses.  ?   Heart sounds: Normal heart sounds.  ?Pulmonary:  ?   Effort: Pulmonary effort is normal.  ?   Breath sounds: Normal breath sounds.  ?Abdominal:  ?   General: Abdomen is flat. Bowel sounds are normal.  ?   Palpations: Abdomen is soft.  ?Musculoskeletal:     ?   General: Normal range of motion.  ?   Cervical back: Normal range of motion and neck supple.  ?Skin: ?   General: Skin is warm.  ?Neurological:  ?   Mental Status: She is alert and oriented to person, place, and time.  ? ?ASSESSMENT AND PLAN: ? ?Ms. Adrian Prince was here today annual physical examination. ? ?No orders of the defined types were placed in this encounter. ? ? ?There are no diagnoses linked to this encounter. ? ?No problem-specific Assessment & Plan notes found for this encounter. ? ? ?No follow-ups on file. ? ?Betty G. Martinique, MD ? ?[place]. ?[place] office. ? ? ? ? ? ? ? ? ? ? ? ? ? ?

## 2021-04-27 ENCOUNTER — Telehealth: Payer: Self-pay | Admitting: Family Medicine

## 2021-04-27 ENCOUNTER — Encounter: Payer: 59 | Admitting: Family Medicine

## 2021-04-27 DIAGNOSIS — E119 Type 2 diabetes mellitus without complications: Secondary | ICD-10-CM

## 2021-04-27 DIAGNOSIS — Z13220 Encounter for screening for lipoid disorders: Secondary | ICD-10-CM

## 2021-04-27 DIAGNOSIS — Z0000 Encounter for general adult medical examination without abnormal findings: Secondary | ICD-10-CM

## 2021-04-27 DIAGNOSIS — I10 Essential (primary) hypertension: Secondary | ICD-10-CM

## 2021-04-27 MED ORDER — GLIPIZIDE ER 5 MG PO TB24: 5.0000 mg | ORAL_TABLET | Freq: Every day | ORAL | 0 refills | Status: DC

## 2021-04-27 NOTE — Telephone Encounter (Signed)
Rx sent in

## 2021-04-27 NOTE — Telephone Encounter (Signed)
Patient called in requesting a refill for glipiZIDE (GLUCOTROL XL) 5 MG 24 hr tablet [[id number]]  to be sent to their pharmacy. ? ?Please advise. ?

## 2021-05-07 NOTE — Progress Notes (Signed)
? ?HPI: ?JennaJenna Myers is a 56 y.o. female, who is here today for her routine physical and follow up. ?She has not followed on her chronic medical problems since 2021, I asked her if we can do just the follow up. She wants CPE and follow up today. ? ?Last CPE: unsure ? ?Regular exercise : She has not been consistent but planning on doing so. ?Following a healthy diet: Intermittent fasting since last week and decreased sweets. Reports that she has lost some wt. ? ?Chronic medical problems: DM type 2, hypertension,HLD ? ?Immunization History  ?Administered Date(s) Administered  ? Td 04/16/2008  ? Tdap 05/10/2021  ? ?Health Maintenance  ?Topic Date Due  ? OPHTHALMOLOGY EXAM  Never done  ? COVID-19 Vaccine (1) 05/23/2021 (Originally 09/18/1965)  ? Zoster Vaccines- Shingrix (1 of 2) 08/09/2021 (Originally 03/21/2014)  ? INFLUENZA VACCINE  08/24/2021  ? HEMOGLOBIN A1C  11/09/2021  ? FOOT EXAM  05/11/2022  ? MAMMOGRAM  03/02/2023  ? PAP SMEAR-Modifier  01/13/2024  ? COLONOSCOPY (Pts 45-79yr Insurance coverage will need to be confirmed)  05/25/2024  ? TETANUS/TDAP  05/11/2031  ? HPV VACCINES  Aged Out  ? Hepatitis C Screening  Discontinued  ? HIV Screening  Discontinued  ? ?She follows with her gynecologist annually, last seen on 01/12/2021, Dr AHarolyn Rutherford ? ?Follow up: ? ?Diabetes Mellitus II: Around 2017-2018. ?- Checking BG at home: Occasionally, 130's-190's. She has had some low BS's when she skips meals. ?- Medications: Glipizide XL 5 mg daily. ?- eye exam: She follows with eye care provider a few times per year, next appt in 2 weeks. ?- foot exam: 03/2019. ? ?- Negative for symptoms of hypoglycemia, polyuria, polydipsia, numbness extremities, foot ulcers/trauma ?She does not want Metformin because her mother was taking this medication and she died from cancer. ? ?Lab Results  ?Component Value Date  ? HGBA1C 10.5 (H) 03/25/2019  ? ?Lab Results  ?Component Value Date  ? MICROALBUR <0.7 03/25/2019   ? ?Hypertension:  ?Medications: Lisinopril 20 mg daily and HCTZ 25 mg daily. ?Side effects: None. ?Negative for unusual or severe headache, visual changes, exertional chest pain, dyspnea,  focal weakness, or edema. ? ?Lab Results  ?Component Value Date  ? CREATININE 0.72 03/25/2019  ? BUN 7 03/25/2019  ? NA 138 03/25/2019  ? K 3.9 03/25/2019  ? CL 100 03/25/2019  ? CO2 27 03/25/2019  ? ?HLD: She is not on pharmacologic treatment, she is not interested in starting statin. ? ?Lab Results  ?Component Value Date  ? CHOL 170 03/25/2019  ? HDL 66.90 03/25/2019  ? LP[place]88 03/25/2019  ? TRIG 71.0 03/25/2019  ? CHOLHDL 3 03/25/2019  ? ?Review of Systems  ?Constitutional:  Negative for appetite change and fever.  ?HENT:  Negative for hearing loss, mouth sores, sore throat, trouble swallowing and voice change.   ?Eyes:  Negative for redness and visual disturbance.  ?Respiratory:  Negative for cough, shortness of breath and wheezing.   ?Cardiovascular:  Negative for chest pain and leg swelling.  ?Gastrointestinal:  Negative for abdominal pain, nausea and vomiting.  ?     No changes in bowel habits.  ?Endocrine: Negative for cold intolerance, heat intolerance, polydipsia, polyphagia and polyuria.  ?Genitourinary:  Negative for decreased urine volume, dysuria, hematuria, vaginal bleeding and vaginal discharge.  ?Musculoskeletal:  Negative for gait problem and myalgias.  ?Skin:  Negative for color change and rash.  ?Allergic/Immunologic: Positive for environmental allergies.  ?Neurological:  Negative for syncope, weakness and  headaches.  ?Hematological:  Negative for adenopathy. Does not bruise/bleed easily.  ?Psychiatric/Behavioral:  Negative for confusion. The patient is not nervous/anxious.   ?All other systems reviewed and are negative. ? ?Current Outpatient Medications on File Prior to Visit  ?Medication Sig Dispense Refill  ? aspirin EC 81 MG tablet Take 81 mg by mouth daily.    ? hydrochlorothiazide (HYDRODIURIL) 25 MG  tablet Take 1 tablet (25 mg total) by mouth daily. 90 tablet 3  ? lisinopril (ZESTRIL) 20 MG tablet Take 1 tablet (20 mg total) by mouth daily. 90 tablet 3  ? Multiple Vitamins-Minerals (MULTIVITAMIN ADULT PO) Take 1 capsule by mouth daily.    ? nystatin-triamcinolone (MYCOLOG II) cream Apply 1 application topically 2 (two) times daily. Apply to affected area 30 g 1  ? timolol (BETIMOL) 0.5 % ophthalmic solution 1 drop 2 (two) times daily.    ? ?No current facility-administered medications on file prior to visit.  ? ?Past Medical History:  ?Diagnosis Date  ? Allergy   ? seasonal  ? Cancer [facility]) MOTHER  ? WILLETTE  ? Constipation   ? prune juice and probiotic helps  ? Diabetes mellitus without complication ([place])   ? questionable-Hgb A1C down to 6.7  ? Heart murmur 2010  ? Hypertension   ? Stroke [facility]) FATHER  ? MITCHELL  ? ? ?Past Surgical History:  ?Procedure Laterality Date  ? CORONARY ARTERY BYPASS GRAFT  Father  ? Alroy Dust  ? VAGINAL DELIVERY    ? x1  ? ?Allergies  ?Allergen Reactions  ? Penicillin G Itching and Rash  ? ?Family History  ?Problem Relation Age of Onset  ? Heart disease Father   ? Diabetes Father   ? Hypertension Father   ? Stroke Father   ? Colon cancer Neg Hx   ? ? ?Social History  ? ?Socioeconomic History  ? Marital status: Single  ?  Spouse name: Not on file  ? Number of children: Not on file  ? Years of education: Not on file  ? Highest education level: Bachelor's degree (e.g., BA, AB, BS)  ?Occupational History  ? Not on file  ?Tobacco Use  ? Smoking status: Never  ? Smokeless tobacco: Never  ?Substance and Sexual Activity  ? Alcohol use: No  ?  Alcohol/week: 0.0 standard drinks  ? Drug use: No  ? Sexual activity: Not Currently  ?  Birth control/protection: Abstinence, Other-see comments  ?Other Topics Concern  ? Not on file  ?Social History Narrative  ? Not on file  ? ?Social Determinants of Health  ? ?Financial Resource Strain: Low Risk   ? Difficulty of Paying Living Expenses: Not very hard   ?Food Insecurity: Food Insecurity Present  ? Worried About Charity fundraiser in the Last Year: Sometimes true  ? Ran Out of Food in the Last Year: Never true  ?Transportation Needs: No Transportation Needs  ? Lack of Transportation (Medical): No  ? Lack of Transportation (Non-Medical): No  ?Physical Activity: Sufficiently Active  ? Days of Exercise per Week: 3 days  ? Minutes of Exercise per Session: 60 min  ?Stress: No Stress Concern Present  ? Feeling of Stress : Not at all  ?Social Connections: Moderately Integrated  ? Frequency of Communication with Friends and Family: More than three times a week  ? Frequency of Social Gatherings with Friends and Family: Three times a week  ? Attends Religious Services: More than 4 times per year  ? Active Member of Clubs or  Organizations: Yes  ? Attends Archivist Meetings: 1 to 4 times per year  ? Marital Status: Never married  ? ?Vitals:  ? 05/10/21 0652  ?BP: 134/80  ?Pulse: 86  ?Resp: 16  ?SpO2: 99%  ? ?Body mass index is 28.98 kg/m?. ? ?Wt Readings from Last 3 Encounters:  ?05/10/21 185 lb (83.9 kg)  ?02/03/21 186 lb 9.6 oz (84.6 kg)  ?01/12/21 181 lb (82.1 kg)  ? ?Physical Exam ?Vitals and nursing note reviewed.  ?Constitutional:   ?   General: She is not in acute distress. ?   Appearance: She is well-developed.  ?HENT:  ?   Head: Normocephalic and atraumatic.  ?   Right Ear: External ear normal.  ?   Left Ear: Tympanic membrane, ear canal and external ear normal.  ?   Ears:  ?   Comments: Right ear canal cerumen excess, could not see TM. ?   Mouth/Throat:  ?   Mouth: Mucous membranes are moist.  ?   Pharynx: Oropharynx is clear. Uvula midline.  ?Eyes:  ?   Extraocular Movements: Extraocular movements intact.  ?   Conjunctiva/sclera: Conjunctivae normal.  ?   Pupils: Pupils are equal, round, and reactive to light.  ?Neck:  ?   Thyroid: No thyroid mass or thyromegaly (Palpable.).  ?   Trachea: No tracheal deviation.  ?Cardiovascular:  ?   Rate and Rhythm:  Normal rate and regular rhythm.  ?   Pulses:     ?     Dorsalis pedis pulses are 2+ on the right side and 2+ on the left side.  ?   Heart sounds: No murmur heard. ?Pulmonary:  ?   Effort: Pulmonary effort is normal. No

## 2021-05-10 ENCOUNTER — Ambulatory Visit (INDEPENDENT_AMBULATORY_CARE_PROVIDER_SITE_OTHER): Payer: 59 | Admitting: Family Medicine

## 2021-05-10 ENCOUNTER — Encounter: Payer: Self-pay | Admitting: Family Medicine

## 2021-05-10 VITALS — BP 134/80 | HR 86 | Resp 16 | Ht 67.0 in | Wt 185.0 lb

## 2021-05-10 DIAGNOSIS — I10 Essential (primary) hypertension: Secondary | ICD-10-CM

## 2021-05-10 DIAGNOSIS — E1169 Type 2 diabetes mellitus with other specified complication: Secondary | ICD-10-CM | POA: Diagnosis not present

## 2021-05-10 DIAGNOSIS — E785 Hyperlipidemia, unspecified: Secondary | ICD-10-CM | POA: Diagnosis not present

## 2021-05-10 DIAGNOSIS — Z23 Encounter for immunization: Secondary | ICD-10-CM

## 2021-05-10 DIAGNOSIS — Z0000 Encounter for general adult medical examination without abnormal findings: Secondary | ICD-10-CM

## 2021-05-10 DIAGNOSIS — Z5320 Procedure and treatment not carried out because of patient's decision for unspecified reasons: Secondary | ICD-10-CM

## 2021-05-10 DIAGNOSIS — E119 Type 2 diabetes mellitus without complications: Secondary | ICD-10-CM

## 2021-05-10 LAB — POCT GLYCOSYLATED HEMOGLOBIN (HGB A1C): Hemoglobin A1C: 11.8 % — AB (ref 4.0–5.6)

## 2021-05-10 LAB — TSH: TSH: 0.94 u[IU]/mL (ref 0.35–5.50)

## 2021-05-10 LAB — MICROALBUMIN / CREATININE URINE RATIO
Creatinine,U: 52.9 mg/dL
Microalb Creat Ratio: 1.3 mg/g (ref 0.0–30.0)
Microalb, Ur: <0.7 mg/dL (ref 0.0–1.9)

## 2021-05-10 LAB — COMPREHENSIVE METABOLIC PANEL
ALT: 30 U/L (ref 0–35)
AST: 23 U/L (ref 0–37)
Albumin: 4.1 g/dL (ref 3.5–5.2)
Alkaline Phosphatase: 83 U/L (ref 39–117)
BUN: 8 mg/dL (ref 6–23)
CO2: 28 mEq/L (ref 19–32)
Calcium: 9.3 mg/dL (ref 8.4–10.5)
Chloride: 97 mEq/L (ref 96–112)
Creatinine, Ser: 0.68 mg/dL (ref 0.40–1.20)
GFR: 96.87 mL/min (ref >60.00)
Glucose, Bld: 234 mg/dL — ABNORMAL HIGH (ref 70–99)
Potassium: 3.5 mEq/L (ref 3.5–5.1)
Sodium: 135 mEq/L (ref 135–145)
Total Bilirubin: 0.5 mg/dL (ref 0.2–1.2)
Total Protein: 7.5 g/dL (ref 6.0–8.3)

## 2021-05-10 LAB — LIPID PANEL
Cholesterol: 181 mg/dL (ref 0–200)
HDL: 68.7 mg/dL (ref >39.00)
LDL Cholesterol: 91 mg/dL (ref 0–99)
NonHDL: 112.2
Total CHOL/HDL Ratio: 3
Triglycerides: 107 mg/dL (ref 0.0–149.0)
VLDL: 21.4 mg/dL (ref 0.0–40.0)

## 2021-05-10 MED ORDER — GLIPIZIDE ER 5 MG PO TB24: 5.0000 mg | ORAL_TABLET | Freq: Every day | ORAL | 1 refills | Status: DC

## 2021-05-10 MED ORDER — FREESTYLE LIBRE SENSOR SYSTEM MISC
1.0000 | Freq: Three times a day (TID) | 2 refills | Status: DC | PRN
Start: 2021-05-10 — End: 2021-05-10

## 2021-05-10 MED ORDER — FREESTYLE LIBRE 2 SENSOR MISC: 3 refills | Status: DC

## 2021-05-10 MED ORDER — FREESTYLE LIBRE READER DEVI: 1.0000 | 3 refills | Status: DC

## 2021-05-10 MED ORDER — FREESTYLE LIBRE 2 READER DEVI: 3 refills | Status: DC

## 2021-05-10 NOTE — Assessment & Plan Note (Signed)
She is not interested in starting statin medication, we discussed CV benefits. ?Further recommendation will be given according to lipid panel results, she is not fasting today. ?

## 2021-05-10 NOTE — Assessment & Plan Note (Addendum)
Problem is not adequately controlled.HgA1C went from 10.5 in 03/2019 to 11.8. ?We discussed possible complications of elevated glucose. ? ?Refused pneumonia vaccine and statin medication. ?She is not interested in trying metformin or any injectable medication. ?Prefers to hold on endocrinology referral. ?She is not interested in having diabetes/nutrition education arranged.  States that she can see a coatch through her insurance to help with diet and weight loss. ?She agrees with trying samples of Jardiance 10 mg daily, we discussed some side effects. ? ?Regular exercise and healthy diet with avoidance of added sugar food intake is an important part of treatment and encouraged. ?Annual eye exam, periodic dental and foot care recommended. ?F/U in 3-4 months ? ?

## 2021-05-10 NOTE — Patient Instructions (Addendum)
A few things to remember from today's visit: ? ?Routine general medical examination at a health care facility ? ?Type 2 diabetes mellitus without complication, without long-term current use of insulin ([place]) - Plan: POC HgB A1c, Comprehensive metabolic panel, Microalbumin / creatinine urine ratio ? ?Benign essential hypertension - Plan: Comprehensive metabolic panel, TSH ? ?Screening for lipoid disorders - Plan: Lipid panel ? ?If you need refills please call your pharmacy. ?Do not use My Chart to request refills or for acute issues that need immediate attention. ?  ?Please be sure medication list is accurate. ?If a new problem present, please set up appointment sooner than planned today. ? ?Hemoglobin A1C is far from goal, so today Jardiance 10 mg added. ?No changes in Glipizide. ?Sign with nutrition program through work. ? ?Health Maintenance, Female ?Adopting a healthy lifestyle and getting preventive care are important in promoting health and wellness. Ask your health care provider about: ?The right schedule for you to have regular tests and exams. ?Things you can do on your own to prevent diseases and keep yourself healthy. ?What should I know about diet, weight, and exercise? ?Eat a healthy diet ? ?Eat a diet that includes plenty of vegetables, fruits, low-fat dairy products, and lean protein. ?Do not eat a lot of foods that are high in solid fats, added sugars, or sodium. ?Maintain a healthy weight ?Body mass index (BMI) is used to identify weight problems. It estimates body fat based on height and weight. Your health care provider can help determine your BMI and help you achieve or maintain a healthy weight. ?Get regular exercise ?Get regular exercise. This is one of the most important things you can do for your health. Most adults should: ?Exercise for at least 150 minutes each week. The exercise should increase your heart rate and make you sweat (moderate-intensity exercise). ?Do strengthening exercises at  least twice a week. This is in addition to the moderate-intensity exercise. ?Spend less time sitting. Even light physical activity can be beneficial. ?Watch cholesterol and blood lipids ?Have your blood tested for lipids and cholesterol at 57 years of age, then have this test every 5 years. ?Have your cholesterol levels checked more often if: ?Your lipid or cholesterol levels are high. ?You are older than 57 years of age. ?You are at high risk for heart disease. ?What should I know about cancer screening? ?Depending on your health history and family history, you may need to have cancer screening at various ages. This may include screening for: ?Breast cancer. ?Cervical cancer. ?Colorectal cancer. ?Skin cancer. ?Lung cancer. ?What should I know about heart disease, diabetes, and high blood pressure? ?Blood pressure and heart disease ?High blood pressure causes heart disease and increases the risk of stroke. This is more likely to develop in people who have high blood pressure readings or are overweight. ?Have your blood pressure checked: ?Every 3-5 years if you are 37-42 years of age. ?Every year if you are 27 years old or older. ?Diabetes ?Have regular diabetes screenings. This checks your fasting blood sugar level. Have the screening done: ?Once every three years after age 31 if you are at a normal weight and have a low risk for diabetes. ?More often and at a younger age if you are overweight or have a high risk for diabetes. ?What should I know about preventing infection? ?Hepatitis B ?If you have a higher risk for hepatitis B, you should be screened for this virus. Talk with your health care provider to find out if  you are at risk for hepatitis B infection. ?Hepatitis C ?Testing is recommended for: ?Everyone born from [birth year] through 1966. ?Anyone with known risk factors for hepatitis C. ?Sexually transmitted infections (STIs) ?Get screened for STIs, including gonorrhea and chlamydia, if: ?You are sexually active  and are younger than 57 years of age. ?You are older than 57 years of age and your health care provider tells you that you are at risk for this type of infection. ?Your sexual activity has changed since you were last screened, and you are at increased risk for chlamydia or gonorrhea. Ask your health care provider if you are at risk. ?Ask your health care provider about whether you are at high risk for HIV. Your health care provider may recommend a prescription medicine to help prevent HIV infection. If you choose to take medicine to prevent HIV, you should first get tested for HIV. You should then be tested every 3 months for as long as you are taking the medicine. ?Pregnancy ?If you are about to stop having your period (premenopausal) and you may become pregnant, seek counseling before you get pregnant. ?Take 400 to 800 micrograms (mcg) of folic acid every day if you become pregnant. ?Ask for birth control (contraception) if you want to prevent pregnancy. ?Osteoporosis and menopause ?Osteoporosis is a disease in which the bones lose minerals and strength with aging. This can result in bone fractures. If you are 26 years old or older, or if you are at risk for osteoporosis and fractures, ask your health care provider if you should: ?Be screened for bone loss. ?Take a calcium or vitamin D supplement to lower your risk of fractures. ?Be given hormone replacement therapy (HRT) to treat symptoms of menopause. ?Follow these instructions at home: ?Alcohol use ?Do not drink alcohol if: ?Your health care provider tells you not to drink. ?You are pregnant, may be pregnant, or are planning to become pregnant. ?If you drink alcohol: ?Limit how much you have to: ?0-1 drink a day. ?Know how much alcohol is in your drink. In the [place] one drink equals one 12 oz bottle of beer (355 mL), one 5 oz glass of wine (148 mL), or one 1? oz glass of hard liquor (44 mL). ?Lifestyle ?Do not use any products that contain nicotine or tobacco.  These products include cigarettes, chewing tobacco, and vaping devices, such as e-cigarettes. If you need help quitting, ask your health care provider. ?Do not use street drugs. ?Do not share needles. ?Ask your health care provider for help if you need support or information about quitting drugs. ?General instructions ?Schedule regular health, dental, and eye exams. ?Stay current with your vaccines. ?Tell your health care provider if: ?You often feel depressed. ?You have ever been abused or do not feel safe at home. ?Summary ?Adopting a healthy lifestyle and getting preventive care are important in promoting health and wellness. ?Follow your health care provider's instructions about healthy diet, exercising, and getting tested or screened for diseases. ?Follow your health care provider's instructions on monitoring your cholesterol and blood pressure. ?This information is not intended to replace advice given to you by your health care provider. Make sure you discuss any questions you have with your health care provider. ?Document Revised: 06/01/2020 Document Reviewed: 06/01/2020 ?Elsevier Patient Education ? [place]. ? ?

## 2021-05-10 NOTE — Assessment & Plan Note (Signed)
BP otherwise adequately controlled. ?Continue lisinopril 20 mg daily and HCTZ 25 mg daily. ?Low-salt diet to continue. ?Eye exam is current. ?

## 2021-11-23 ENCOUNTER — Ambulatory Visit (INDEPENDENT_AMBULATORY_CARE_PROVIDER_SITE_OTHER): Payer: 59

## 2021-11-23 DIAGNOSIS — Z23 Encounter for immunization: Secondary | ICD-10-CM

## 2021-12-13 ENCOUNTER — Other Ambulatory Visit: Payer: Self-pay | Admitting: Family Medicine

## 2021-12-13 DIAGNOSIS — E1169 Type 2 diabetes mellitus with other specified complication: Secondary | ICD-10-CM

## 2022-01-27 ENCOUNTER — Other Ambulatory Visit: Payer: Self-pay | Admitting: Family Medicine

## 2022-01-27 DIAGNOSIS — I10 Essential (primary) hypertension: Secondary | ICD-10-CM

## 2022-02-01 ENCOUNTER — Other Ambulatory Visit: Payer: Self-pay | Admitting: Obstetrics & Gynecology

## 2022-02-01 DIAGNOSIS — Z1231 Encounter for screening mammogram for malignant neoplasm of breast: Secondary | ICD-10-CM

## 2022-03-07 ENCOUNTER — Ambulatory Visit
Admission: RE | Admit: 2022-03-07 | Discharge: 2022-03-07 | Disposition: A | Discharge status: Home / Self Care | Payer: 59 | Source: Ambulatory Visit | Attending: Internal Medicine | Admitting: Internal Medicine

## 2022-03-07 VITALS — BP 142/89 | HR 81 | Temp 98.6°F | Resp 20

## 2022-03-07 DIAGNOSIS — R350 Frequency of micturition: Secondary | ICD-10-CM

## 2022-03-07 DIAGNOSIS — E119 Type 2 diabetes mellitus without complications: Secondary | ICD-10-CM | POA: Diagnosis not present

## 2022-03-07 DIAGNOSIS — N3001 Acute cystitis with hematuria: Secondary | ICD-10-CM

## 2022-03-07 LAB — POCT URINALYSIS DIP (MANUAL ENTRY)
Bilirubin, UA: NEGATIVE
Glucose, UA: >=1000 mg/dL — AB
Nitrite, UA: NEGATIVE
Protein Ur, POC: NEGATIVE mg/dL
Spec Grav, UA: 1.015 (ref 1.010–1.025)
Urobilinogen, UA: 0.2 E.U./dL
pH, UA: 5.5 (ref 5.0–8.0)

## 2022-03-07 MED ORDER — CEPHALEXIN 500 MG PO CAPS: 500.0000 mg | ORAL_CAPSULE | Freq: Two times a day (BID) | ORAL | 0 refills | Status: DC

## 2022-03-07 MED ORDER — FLUCONAZOLE 150 MG PO TABS: 150.0000 mg | ORAL_TABLET | ORAL | 0 refills | Status: DC

## 2022-03-07 NOTE — ED Provider Notes (Signed)
[facility]  Note:  This document was prepared using Systems analyst and may include unintentional dictation errors.  MRN: [id number] DOB: 18-Apr-1965  Subjective:   Jenna Myers is a 57 y.o. female presenting for 2-day history of acute onset urinary frequency, urinary urgency and dysuria. Denies fever, n/v, abdominal pain, pelvic pain, rashes, hematuria, vaginal discharge.  Tries to hydrate consistently with water, has been drinking cranberry juice.  Patient gets yeast infections with antibiotics.   No current facility-administered medications for this encounter.  Current Outpatient Medications:    aspirin EC 81 MG tablet, Take 81 mg by mouth daily., Disp: , Rfl:    Continuous Blood Gluc Receiver (FREESTYLE LIBRE 2 READER) DEVI, Use to check sugars, Disp: 1 each, Rfl: 3   Continuous Blood Gluc Sensor (FREESTYLE LIBRE 2 SENSOR) MISC, Use to check sugars., Disp: 3 each, Rfl: 3   glipiZIDE (GLUCOTROL XL) 5 MG 24 hr tablet, Take 1 tablet by mouth once daily with breakfast, Disp: 90 tablet, Rfl: 0   hydrochlorothiazide (HYDRODIURIL) 25 MG tablet, Take 1 tablet by mouth once daily, Disp: 90 tablet, Rfl: 0   lisinopril (ZESTRIL) 20 MG tablet, Take 1 tablet by mouth once daily, Disp: 90 tablet, Rfl: 0   Multiple Vitamins-Minerals (MULTIVITAMIN ADULT PO), Take 1 capsule by mouth daily., Disp: , Rfl:    nystatin-triamcinolone (MYCOLOG II) cream, Apply 1 application topically 2 (two) times daily. Apply to affected area, Disp: 30 g, Rfl: 1   timolol (BETIMOL) 0.5 % ophthalmic solution, 1 drop 2 (two) times daily., Disp: , Rfl:    Allergies  Allergen Reactions   Penicillin G Itching and Rash    Past Medical History:  Diagnosis Date   Allergy    seasonal   Cancer ([place]) MOTHER   WILLETTE   Constipation    prune juice and probiotic helps   Diabetes mellitus without complication ([facility])    questionable-Hgb A1C down to 6.7   Heart murmur 2010    Hypertension    Stroke ([place]) FATHER   MITCHELL     Past Surgical History:  Procedure Laterality Date   CORONARY ARTERY BYPASS GRAFT  Father   Alroy Dust   VAGINAL DELIVERY     x1    Family History  Problem Relation Age of Onset   Heart disease Father    Diabetes Father    Hypertension Father    Stroke Father    Colon cancer Neg Hx     Social History   Tobacco Use   Smoking status: Never   Smokeless tobacco: Never  Vaping Use   Vaping Use: Never used  Substance Use Topics   Alcohol use: No    Alcohol/week: 0.0 standard drinks of alcohol   Drug use: No    ROS   Objective:   Vitals: BP (!) 142/89 (BP Location: Right Arm)   Pulse 81   Temp 98.6 F (37 C) (Oral)   Resp 20   LMP 11/13/2020 (Exact Date)   SpO2 98%   Physical Exam Constitutional:      General: She is not in acute distress.    Appearance: Normal appearance. She is well-developed. She is not ill-appearing, toxic-appearing or diaphoretic.  HENT:     Head: Normocephalic and atraumatic.     Nose: Nose normal.     Mouth/Throat:     Mouth: Mucous membranes are moist.     Pharynx: Oropharynx is clear.  Eyes:     General: No  scleral icterus.       Right eye: No discharge.        Left eye: No discharge.     Extraocular Movements: Extraocular movements intact.     Conjunctiva/sclera: Conjunctivae normal.  Cardiovascular:     Rate and Rhythm: Normal rate.  Pulmonary:     Effort: Pulmonary effort is normal.  Abdominal:     General: Bowel sounds are normal. There is no distension.     Palpations: Abdomen is soft. There is no mass.     Tenderness: There is no abdominal tenderness. There is no right CVA tenderness, left CVA tenderness, guarding or rebound.  Skin:    General: Skin is warm and dry.  Neurological:     General: No focal deficit present.     Mental Status: She is alert and oriented to person, place, and time.  Psychiatric:        Mood and Affect: Mood normal.        Behavior: Behavior  normal.        Thought Content: Thought content normal.        Judgment: Judgment normal.     Results for orders placed or performed during the hospital encounter of 03/07/22 (from the past 24 hour(s))  POCT urinalysis dipstick     Status: Abnormal   Collection Time: 03/07/22  5:50 PM  Result Value Ref Range   Color, UA yellow yellow   Clarity, UA cloudy (A) clear   Glucose, UA >=1,000 (A) negative mg/dL   Bilirubin, UA negative negative   Ketones, POC UA trace (5) (A) negative mg/dL   Spec Grav, UA 1.015 1.010 - 1.025   Blood, UA trace-intact (A) negative   pH, UA 5.5 5.0 - 8.0   Protein Ur, POC negative negative mg/dL   Urobilinogen, UA 0.2 0.2 or 1.0 E.U./dL   Nitrite, UA Negative Negative   Leukocytes, UA Trace (A) Negative    Assessment and Plan :   PDMP not reviewed this encounter.  1. Acute cystitis with hematuria   2. Urinary frequency   3. Type 2 diabetes mellitus treated without insulin ([place])     Start Keflex to cover for acute cystitis, urine culture pending.  Use oral fluconazole to cover for secondary yeast infection from antibiotic use.  Recommended aggressive hydration, limiting urinary irritants. Counseled patient on potential for adverse effects with medications prescribed/recommended today, ER and return-to-clinic precautions discussed, patient verbalized understanding.    Jaynee Eagles, [place] 03/07/22 1958

## 2022-03-07 NOTE — Discharge Instructions (Signed)
Please start Keflex to address an urinary tract infection. Make sure you hydrate very well with plain water and a quantity of 80 ounces of water a day.  Please limit drinks that are considered urinary irritants such as soda, sweet tea, coffee, energy drinks, alcohol.  These can worsen your urinary and genital symptoms but also be the source of them.  I will let you know about your urine culture results through MyChart to see if we need to prescribe or change your antibiotics based off of those results.

## 2022-03-07 NOTE — ED Triage Notes (Signed)
Pt c/o urinary freq, dysuria x 2 days-NAD-steady gait

## 2022-03-08 LAB — URINE CULTURE: Culture: >=100000 — AB

## 2022-03-23 ENCOUNTER — Ambulatory Visit
Admission: RE | Admit: 2022-03-23 | Discharge: 2022-03-23 | Disposition: A | Discharge status: Home / Self Care | Payer: 59 | Source: Ambulatory Visit | Attending: Obstetrics & Gynecology | Admitting: Obstetrics & Gynecology

## 2022-03-23 DIAGNOSIS — Z1231 Encounter for screening mammogram for malignant neoplasm of breast: Secondary | ICD-10-CM

## 2022-04-12 ENCOUNTER — Ambulatory Visit (INDEPENDENT_AMBULATORY_CARE_PROVIDER_SITE_OTHER): Payer: 59 | Admitting: Obstetrics & Gynecology

## 2022-04-12 ENCOUNTER — Other Ambulatory Visit (HOSPITAL_COMMUNITY)
Admission: RE | Admit: 2022-04-12 | Discharge: 2022-04-12 | Disposition: A | Discharge status: Home / Self Care | Payer: 59 | Source: Ambulatory Visit | Attending: Obstetrics & Gynecology | Admitting: Obstetrics & Gynecology

## 2022-04-12 ENCOUNTER — Encounter: Payer: Self-pay | Admitting: Obstetrics & Gynecology

## 2022-04-12 VITALS — BP 144/89 | HR 109 | Wt 188.0 lb

## 2022-04-12 DIAGNOSIS — I10 Essential (primary) hypertension: Secondary | ICD-10-CM | POA: Diagnosis not present

## 2022-04-12 DIAGNOSIS — Z01419 Encounter for gynecological examination (general) (routine) without abnormal findings: Secondary | ICD-10-CM | POA: Diagnosis not present

## 2022-04-12 NOTE — Progress Notes (Signed)
GYNECOLOGY ANNUAL PREVENTATIVE CARE ENCOUNTER NOTE  History:     Jenna Myers is a 57 y.o. PMP female here for a routine annual gynecologic exam.  Current complaints: none.   Denies abnormal vaginal bleeding, discharge, pelvic pain, problems with intercourse or other gynecologic concerns.    Gynecologic History Patient's last menstrual period was 11/13/2020 (exact date). Contraception: post menopausal status Last Pap: 01/12/2021. Result was normal with negative HPV Last Mammogram: 03/23/2022.  Result was normal Last Colonoscopy: 05/26/2014.  Result was normal  Obstetric History OB History  No obstetric history on file.    Past Medical History:  Diagnosis Date   Allergy    seasonal   Cancer ([place]) MOTHER   WILLETTE   Constipation    prune juice and probiotic helps   Diabetes mellitus without complication (HCC)    questionable-Hgb A1C down to 6.7   Heart murmur 2010   Hyperlipidemia associated with type 2 diabetes mellitus ([place]) 03/25/2019   Hypertension    Stroke ([place]) FATHER   MITCHELL    Past Surgical History:  Procedure Laterality Date   CORONARY ARTERY BYPASS GRAFT  Father   Alroy Dust   VAGINAL DELIVERY     x1    Current Outpatient Medications on File Prior to Visit  Medication Sig Dispense Refill   aspirin EC 81 MG tablet Take 81 mg by mouth daily.     Continuous Blood Gluc Receiver (FREESTYLE LIBRE 2 READER) DEVI Use to check sugars 1 each 3   Continuous Blood Gluc Sensor (FREESTYLE LIBRE 2 SENSOR) MISC Use to check sugars. 3 each 3   glipiZIDE (GLUCOTROL XL) 5 MG 24 hr tablet Take 1 tablet by mouth once daily with breakfast 90 tablet 0   hydrochlorothiazide (HYDRODIURIL) 25 MG tablet Take 1 tablet by mouth once daily 90 tablet 0   lisinopril (ZESTRIL) 20 MG tablet Take 1 tablet by mouth once daily 90 tablet 0   Multiple Vitamins-Minerals (MULTIVITAMIN ADULT PO) Take 1 capsule by mouth daily.     cephALEXin (KEFLEX) 500 MG capsule Take 1 capsule (500  mg total) by mouth 2 (two) times daily. (Patient not taking: Reported on 04/12/2022) 10 capsule 0   fluconazole (DIFLUCAN) 150 MG tablet Take 1 tablet (150 mg total) by mouth once a week. (Patient not taking: Reported on 04/12/2022) 2 tablet 0   nystatin-triamcinolone (MYCOLOG II) cream Apply 1 application topically 2 (two) times daily. Apply to affected area (Patient not taking: Reported on 04/12/2022) 30 g 1   timolol (BETIMOL) 0.5 % ophthalmic solution 1 drop 2 (two) times daily. (Patient not taking: Reported on 04/12/2022)     No current facility-administered medications on file prior to visit.    Allergies  Allergen Reactions   Penicillin G Itching and Rash    Social History:  reports that she has never smoked. She has never used smokeless tobacco. She reports that she does not drink alcohol and does not use drugs.  Family History  Problem Relation Age of Onset   Heart disease Father    Diabetes Father    Hypertension Father    Stroke Father    Colon cancer Neg Hx     The following portions of the patient's history were reviewed and updated as appropriate: allergies, current medications, past family history, past medical history, past social history, past surgical history and problem list.  Review of Systems Pertinent items noted in HPI and remainder of comprehensive ROS otherwise negative.  Physical Exam:  BP Marland Kitchen)  174/113   Pulse (!) 109   Wt 188 lb (85.3 kg)   LMP 11/13/2020 (Exact Date)   BMI 29.44 kg/m  Vitals:   04/12/22 1336 04/12/22 1400  BP: (!) 174/113 (!) 144/89    CONSTITUTIONAL: Well-developed, well-nourished female in no acute distress.  HENT:  Normocephalic, atraumatic, External right and left ear normal.  EYES: Conjunctivae and EOM are normal. Pupils are equal, round, and reactive to light. No scleral icterus.  NECK: Normal range of motion, supple, no masses.  Normal thyroid.  SKIN: Skin is warm and dry. No rash noted. Not diaphoretic. No erythema. No  pallor. MUSCULOSKELETAL: Normal range of motion. No tenderness.  No cyanosis, clubbing, or edema. NEUROLOGIC: Alert and oriented to person, place, and time. Normal reflexes, muscle tone coordination.  PSYCHIATRIC: Normal mood and affect. Normal behavior. Normal judgment and thought content. CARDIOVASCULAR: Normal heart rate noted, regular rhythm RESPIRATORY: Clear to auscultation bilaterally. Effort and breath sounds normal, no problems with respiration noted. BREASTS: Symmetric in size. No masses, tenderness, skin changes, nipple drainage, or lymphadenopathy bilaterally. Performed in the presence of a chaperone. ABDOMEN: Soft, no distention noted.  No tenderness, rebound or guarding.  PELVIC: Normal appearing external genitalia and urethral meatus; normal appearing vaginal mucosa and cervix.  No abnormal vaginal discharge noted.  Pap smear obtained.  Normal uterine size, no other palpable masses, no uterine or adnexal tenderness.  Performed in the presence of a chaperone.   Assessment and Plan:    1. Essential (primary) hypertension Will follow up with PCP.  2. Well woman exam with routine gynecological exam - Cytology - PAP Will follow up results of pap smear and manage accordingly. Mammogram and colon cancer screening are up to date Routine preventative health maintenance measures emphasized. Please refer to After Visit Summary for other counseling recommendations.      Verita Schneiders, MD, [place] for [employer], [place]

## 2022-04-15 LAB — CYTOLOGY - PAP
Comment: NEGATIVE
Diagnosis: NEGATIVE
High risk HPV: NEGATIVE

## 2022-04-27 ENCOUNTER — Other Ambulatory Visit: Payer: Self-pay | Admitting: Family Medicine

## 2022-04-27 DIAGNOSIS — I10 Essential (primary) hypertension: Secondary | ICD-10-CM

## 2022-07-03 IMAGING — MG MM DIGITAL SCREENING BILAT W/ TOMO AND CAD
8 series · 8 of 24 positions shown · non-contrast
Comparison: Previous exam(s).

CLINICAL DATA: Screening.

EXAM:
DIGITAL SCREENING BILATERAL MAMMOGRAM WITH TOMOSYNTHESIS AND CAD
TECHNIQUE: Bilateral screening digital craniocaudal and mediolateral oblique
mammograms were obtained. Bilateral screening digital breast
tomosynthesis was performed. The images were evaluated with
computer-aided detection.

[R CC synth-2D]
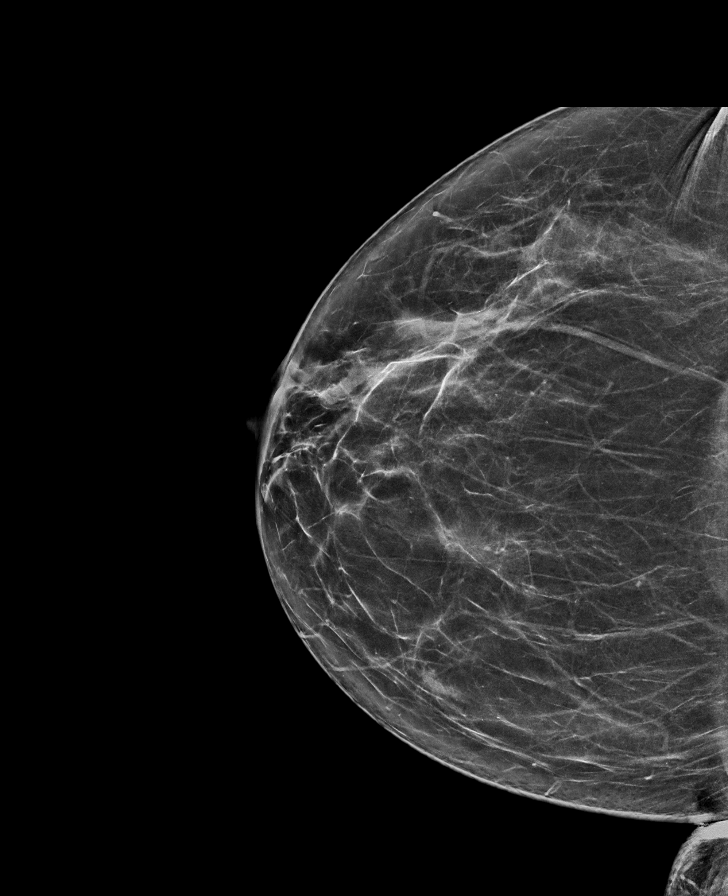

[L CC synth-2D]
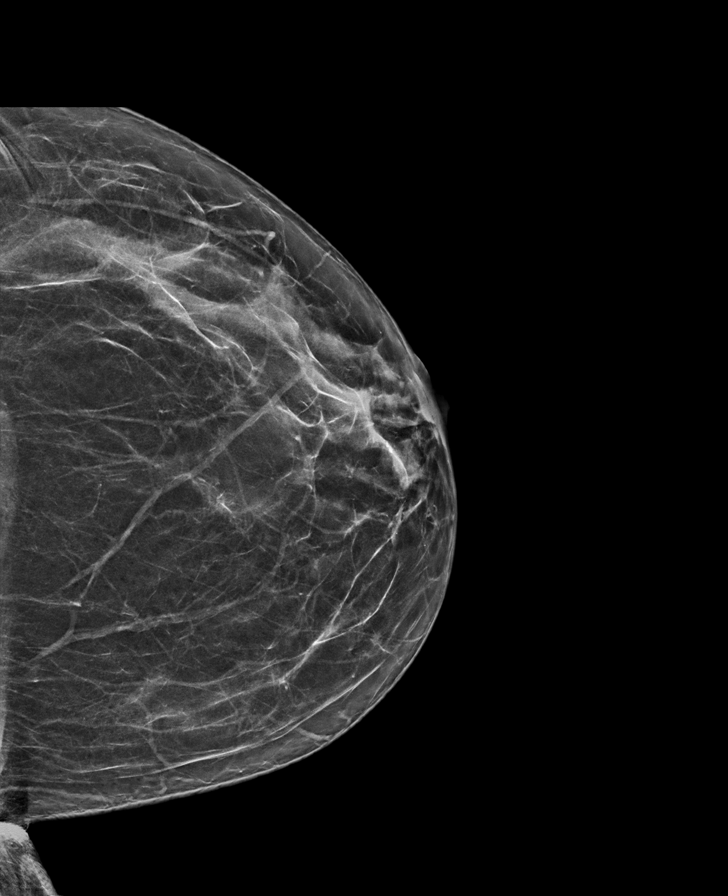

[L MLO synth-2D]
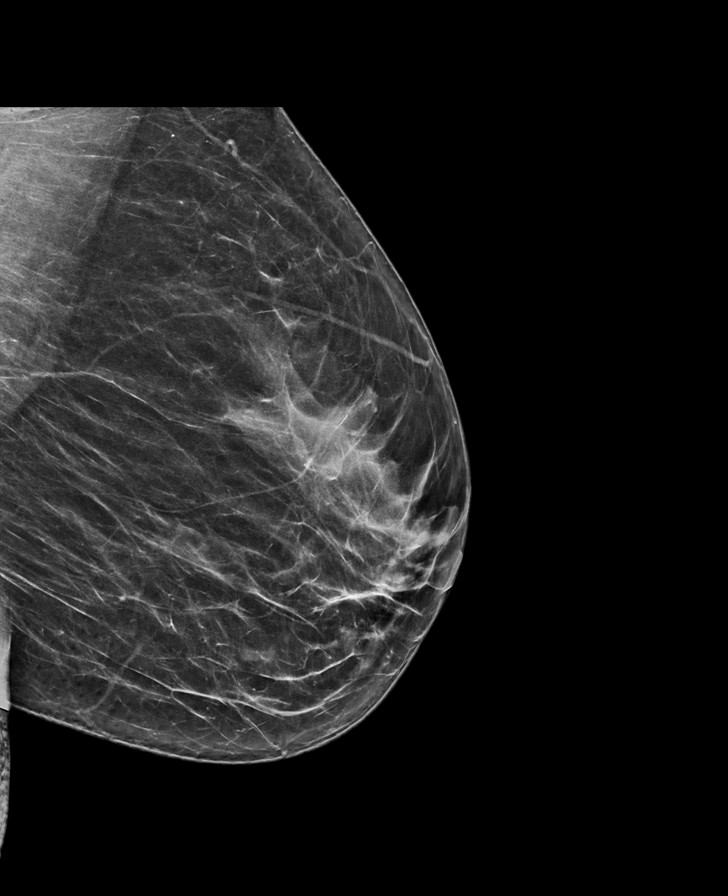

[R MLO synth-2D]
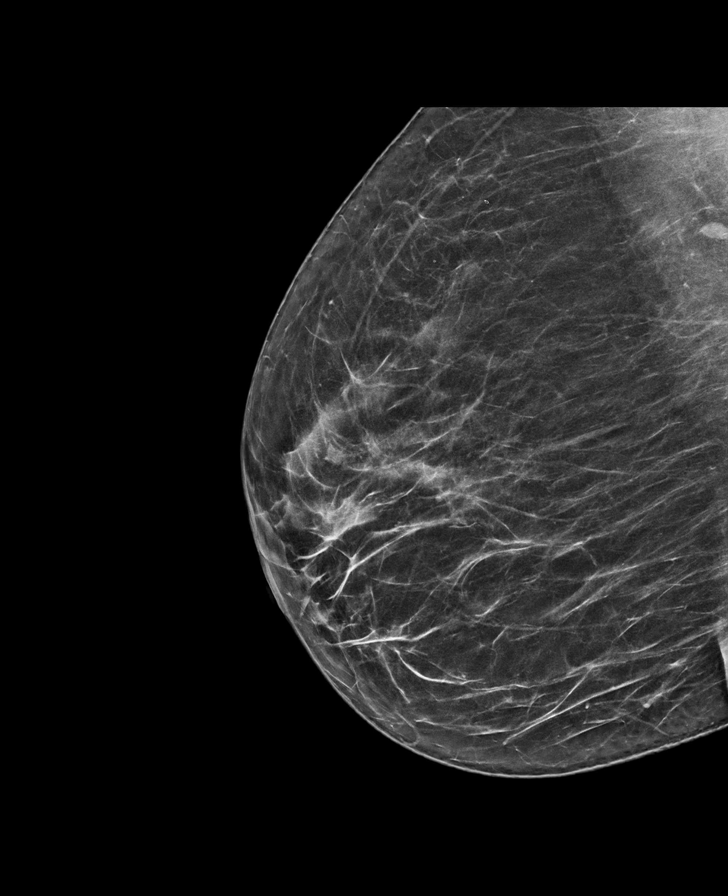

[R MLO tomo · tomo slice 38/75.0]
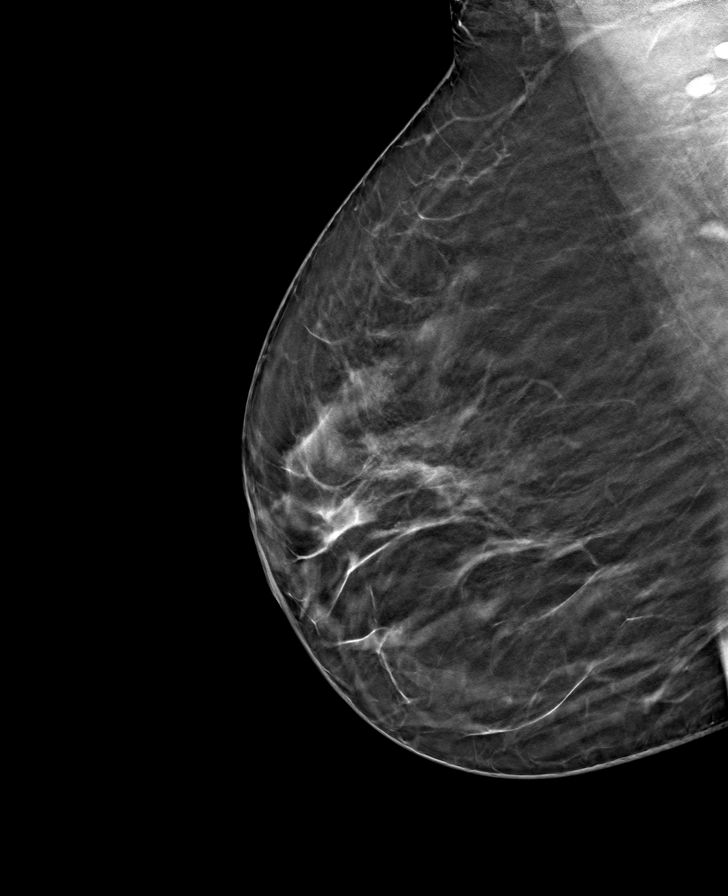

[R CC tomo · tomo slice 37/74.0]
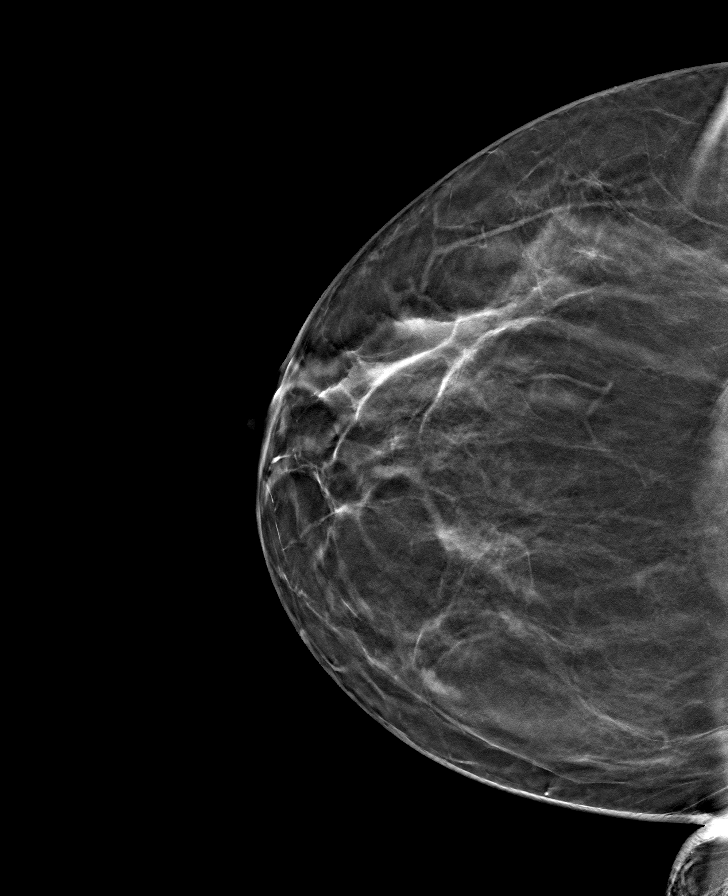

[L MLO tomo · tomo slice 38/75.0]
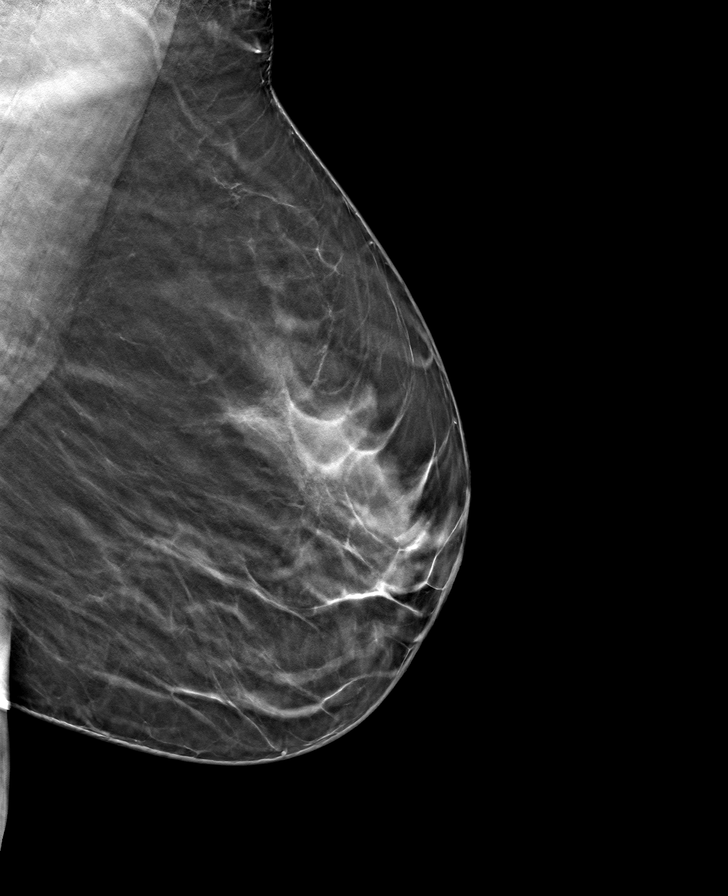

[L CC tomo · tomo slice 34/67.0]
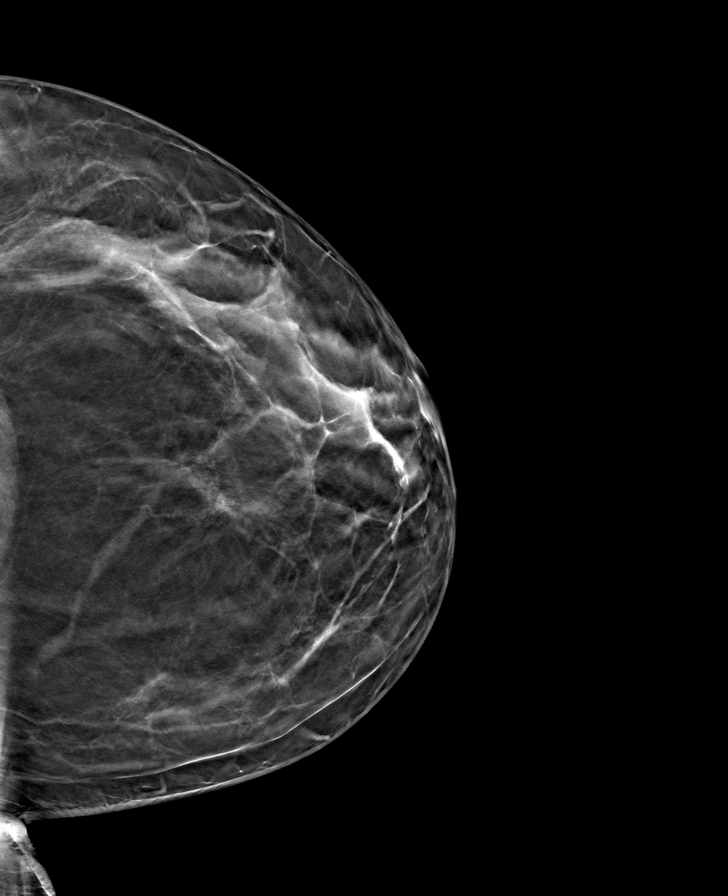

[8 of 24 positions shown; findings below may reference images not displayed]

ACR Breast Density Category b: There are scattered areas of
fibroglandular density.
FINDINGS: There are no findings suspicious for malignancy.
IMPRESSION: No mammographic evidence of malignancy. A result letter of this
screening mammogram will be mailed directly to the patient.

RECOMMENDATION:
Screening mammogram in one year. (Code:51-O-LD2)

BI-RADS CATEGORY  1: Negative.

## 2022-07-25 ENCOUNTER — Other Ambulatory Visit: Payer: Self-pay | Admitting: Family Medicine

## 2022-07-25 DIAGNOSIS — I10 Essential (primary) hypertension: Secondary | ICD-10-CM

## 2022-08-08 LAB — HM DIABETES EYE EXAM

## 2022-08-26 ENCOUNTER — Other Ambulatory Visit: Payer: Self-pay | Admitting: Family Medicine

## 2022-08-26 ENCOUNTER — Encounter: Payer: Self-pay | Admitting: Family Medicine

## 2022-08-26 ENCOUNTER — Ambulatory Visit: Payer: 59 | Admitting: Family Medicine

## 2022-08-26 ENCOUNTER — Ambulatory Visit: Payer: Self-pay | Admitting: Nurse Practitioner

## 2022-08-26 VITALS — BP 132/84 | HR 81 | Temp 98.1°F | Ht 67.0 in | Wt 186.0 lb

## 2022-08-26 DIAGNOSIS — R9431 Abnormal electrocardiogram [ECG] [EKG]: Secondary | ICD-10-CM

## 2022-08-26 DIAGNOSIS — E1169 Type 2 diabetes mellitus with other specified complication: Secondary | ICD-10-CM

## 2022-08-26 DIAGNOSIS — Z0000 Encounter for general adult medical examination without abnormal findings: Secondary | ICD-10-CM | POA: Diagnosis not present

## 2022-08-26 DIAGNOSIS — E1165 Type 2 diabetes mellitus with hyperglycemia: Secondary | ICD-10-CM

## 2022-08-26 DIAGNOSIS — E785 Hyperlipidemia, unspecified: Secondary | ICD-10-CM

## 2022-08-26 DIAGNOSIS — I10 Essential (primary) hypertension: Secondary | ICD-10-CM

## 2022-08-26 DIAGNOSIS — I639 Cerebral infarction, unspecified: Secondary | ICD-10-CM | POA: Insufficient documentation

## 2022-08-26 DIAGNOSIS — E119 Type 2 diabetes mellitus without complications: Secondary | ICD-10-CM | POA: Insufficient documentation

## 2022-08-26 DIAGNOSIS — C801 Malignant (primary) neoplasm, unspecified: Secondary | ICD-10-CM | POA: Insufficient documentation

## 2022-08-26 DIAGNOSIS — K5900 Constipation, unspecified: Secondary | ICD-10-CM | POA: Insufficient documentation

## 2022-08-26 DIAGNOSIS — I152 Hypertension secondary to endocrine disorders: Secondary | ICD-10-CM

## 2022-08-26 DIAGNOSIS — T7840XA Allergy, unspecified, initial encounter: Secondary | ICD-10-CM | POA: Insufficient documentation

## 2022-08-26 DIAGNOSIS — Z7689 Persons encountering health services in other specified circumstances: Secondary | ICD-10-CM

## 2022-08-26 MED ORDER — HYDROCHLOROTHIAZIDE 25 MG PO TABS
25.0000 mg | ORAL_TABLET | Freq: Every day | ORAL | 1 refills | Status: AC
Start: 2022-08-26 — End: ?

## 2022-08-26 MED ORDER — DAPAGLIFLOZIN PROPANEDIOL 10 MG PO TABS
10.0000 mg | ORAL_TABLET | Freq: Every day | ORAL | 3 refills | Status: AC
Start: 2022-08-26 — End: ?

## 2022-08-26 MED ORDER — ATORVASTATIN CALCIUM 20 MG PO TABS
20.0000 mg | ORAL_TABLET | Freq: Every day | ORAL | 11 refills | Status: DC
Start: 2022-08-26 — End: 2023-01-12

## 2022-08-26 MED ORDER — GLIPIZIDE ER 5 MG PO TB24
5.0000 mg | ORAL_TABLET | Freq: Every day | ORAL | 1 refills | Status: DC
Start: 2022-08-26 — End: 2023-02-27

## 2022-08-26 MED ORDER — RYBELSUS 3 MG PO TABS: 3.0000 mg | ORAL_TABLET | Freq: Every day | ORAL | 5 refills | Status: DC

## 2022-08-26 MED ORDER — LISINOPRIL 20 MG PO TABS
20.0000 mg | ORAL_TABLET | Freq: Every day | ORAL | 1 refills | Status: DC
Start: 2022-08-26 — End: 2023-01-12

## 2022-08-26 NOTE — Patient Instructions (Signed)

## 2022-08-26 NOTE — Progress Notes (Addendum)
I,Victoria T Deloria Lair, CMA,acting as a Neurosurgeon for [employer], NP.,have documented all relevant documentation on the behalf of Ellender Hose, NP,as directed by  Ellender Hose, NP while in the presence of Ellender Hose, NP.  Subjective:    Patient ID: Jenna Myers , female    DOB: 02/10/1965 , 57 y.o.   MRN: [id number]  Chief Complaint  Patient presents with   Annual Exam    HPI  Patient presents today to establish care,previous PCP was Dr Betty [place].  She would like her physical completed today. Patient has a medical diagnosis of diabetes, hypertension but she has not been taking her blood pressure medications, but patient admits that she refused to take more than just glipizide 5mg  even though her last A1c was 11.1. Patient states that new medications such as Ozempic, Mounjaro etc are "[place] drugs" that have side effects so she does not want them. She also states that she does not want statins, patient was educated about diabetes, importance of lowering cholesterol. Gave samples of Farxiga in the office.   GYN: Ugonna Anyanwu. She states she had a visit with her on 04/12/22. She reports she has been upto date with her  appointments. She is up to date on mammograms, which she carries out  at the breast center.  Letter sent to Dr Dione Booze for DM eye exam.      Past Medical History:  Diagnosis Date   Allergy    seasonal   Cancer (HCC) MOTHER   WILLETTE   Constipation    prune juice and probiotic helps   Diabetes mellitus without complication (HCC)    questionable-Hgb A1C down to 6.7   Heart murmur 2010   Hyperlipidemia associated with type 2 diabetes mellitus (HCC) 03/25/2019   Hypertension    Stroke (HCC) FATHER   MITCHELL     Family History  Problem Relation Age of Onset   Heart disease Father    Diabetes Father    Hypertension Father    Stroke Father    Colon cancer Neg Hx      Current Outpatient Medications:    aspirin EC 81 MG tablet, Take 81 mg by mouth daily.,  Disp: , Rfl:    atorvastatin (LIPITOR) 20 MG tablet, Take 1 tablet (20 mg total) by mouth daily., Disp: 30 tablet, Rfl: 11   Continuous Blood Gluc Receiver (FREESTYLE LIBRE 2 READER) DEVI, Use to check sugars, Disp: 1 each, Rfl: 3   Continuous Blood Gluc Sensor (FREESTYLE LIBRE 2 SENSOR) MISC, Use to check sugars., Disp: 3 each, Rfl: 3   Multiple Vitamins-Minerals (MULTIVITAMIN ADULT PO), Take 1 capsule by mouth daily., Disp: , Rfl:    dapagliflozin propanediol (FARXIGA) 10 MG TABS tablet, TAKE 1 TABLET BY MOUTH ONCE DAILY BEFORE BREAKFAST, Disp: 30 tablet, Rfl: 3   fluconazole (DIFLUCAN) 150 MG tablet, Take 1 tablet (150 mg total) by mouth once a week. (Patient not taking: Reported on 04/12/2022), Disp: 2 tablet, Rfl: 0   glipiZIDE (GLUCOTROL XL) 5 MG 24 hr tablet, Take 1 tablet (5 mg total) by mouth daily with breakfast., Disp: 90 tablet, Rfl: 1   hydrochlorothiazide (HYDRODIURIL) 25 MG tablet, Take 1 tablet (25 mg total) by mouth daily. Due for physical, Disp: 90 tablet, Rfl: 1   lisinopril (ZESTRIL) 20 MG tablet, Take 1 tablet (20 mg total) by mouth daily. Due for physical, Disp: 90 tablet, Rfl: 1   nystatin-triamcinolone (MYCOLOG II) cream, Apply 1 application topically 2 (two) times daily. Apply to affected  area (Patient not taking: Reported on 04/12/2022), Disp: 30 g, Rfl: 1   timolol (BETIMOL) 0.5 % ophthalmic solution, 1 drop 2 (two) times daily. (Patient not taking: Reported on 04/12/2022), Disp: , Rfl:    Allergies  Allergen Reactions   Penicillin G Itching and Rash        Social History   Tobacco Use  Smoking Status Never  Smokeless Tobacco Never   Social History   Substance and Sexual Activity  Alcohol Use No   Alcohol/week: 0.0 standard drinks of alcohol     Review of Systems  Constitutional: Negative.   HENT: Negative.    Eyes: Negative.   Respiratory: Negative.    Cardiovascular: Negative.   Gastrointestinal: Negative.   Endocrine: Negative.   Genitourinary:  Negative.   Musculoskeletal: Negative.   Skin: Negative.   Allergic/Immunologic: Negative.   Neurological: Negative.   Hematological: Negative.   Psychiatric/Behavioral: Negative.       Today's Vitals   08/26/22 0820  BP: 132/84  Pulse: 81  Temp: 98.1 F (36.7 C)  SpO2: 98%  Weight: 186 lb (84.4 kg)  Height: 5\' 7"  (1.702 m)   Body mass index is 29.13 kg/m.  Wt Readings from Last 3 Encounters:  08/26/22 186 lb (84.4 kg)  04/12/22 188 lb (85.3 kg)  05/10/21 185 lb (83.9 kg)     Objective:  Physical Exam HENT:     Head: Normocephalic.     Mouth/Throat:     Mouth: Mucous membranes are dry.  Cardiovascular:     Rate and Rhythm: Normal rate.     Heart sounds: Murmur heard.  Pulmonary:     Breath sounds: Normal breath sounds.  Abdominal:     General: Bowel sounds are normal.  Feet:     Right foot:     Skin integrity: Skin integrity normal.     Left foot:     Skin integrity: Skin integrity normal.  Skin:    General: Skin is warm and dry.  Neurological:     Mental Status: She is alert and oriented to person, place, and time.  Psychiatric:        Behavior: Behavior normal.         Assessment And Plan:     Type 2 diabetes mellitus with hyperglycemia, without long-term current use of insulin (HCC) Assessment & Plan: Only wants to continue Glypizide QD, patient was advised she needed something else but refused. Gave samples of Farxiga 10mg  every day.  Orders: -     Microalbumin / creatinine urine ratio -     EKG 12-Lead -     CBC -     CMP14+EGFR -     Hemoglobin A1c -     glipiZIDE ER; Take 1 tablet (5 mg total) by mouth daily with breakfast.  Dispense: 90 tablet; Refill: 1 -     POCT urinalysis dipstick; Future  Benign essential hypertension -     hydroCHLOROthiazide; Take 1 tablet (25 mg total) by mouth daily. Due for physical  Dispense: 90 tablet; Refill: 1 -     Lisinopril; Take 1 tablet (20 mg total) by mouth daily. Due for physical  Dispense: 90  tablet; Refill: 1  Abnormal EKG -     Ambulatory referral to Cardiology  Hyperlipidemia associated with type 2 diabetes mellitus (HCC) -     Lipid panel -     Atorvastatin Calcium; Take 1 tablet (20 mg total) by mouth daily.  Dispense: 30 tablet; Refill: 11  Encounter for annual health examination  Encounter to establish care with new doctor     Return for 1 year HM, 4 month DM and BP management. Patient was given opportunity to ask questions. Patient verbalized understanding of the plan and was able to repeat key elements of the plan. All questions were answered to their satisfaction.   Ellender Hose, NP  I, Ellender Hose, NP, have reviewed all documentation for this visit. The documentation on 09/28/22 for the exam, diagnosis, procedures, and orders are all accurate and complete.

## 2022-08-30 ENCOUNTER — Other Ambulatory Visit: Payer: 59

## 2022-08-31 ENCOUNTER — Encounter: Payer: Self-pay | Admitting: Family Medicine

## 2022-08-31 ENCOUNTER — Telehealth: Payer: Self-pay

## 2022-08-31 NOTE — Telephone Encounter (Signed)
  Hello this medication was causing me extreme headache and chest pains.  I stopped taking it as of 08/30/2022. I do not wish to have any other type prescribed for my chlolesterol. thank you, Synetta Fail   Pt notified that provider is currently out of the office until next. Will address once she returns.

## 2022-09-28 ENCOUNTER — Other Ambulatory Visit: Payer: Self-pay | Admitting: Family Medicine

## 2022-09-28 NOTE — Assessment & Plan Note (Signed)
Only wants to continue Glypizide QD, patient was advised she needed something else but refused. Gave samples of Farxiga 10mg  every day.

## 2022-10-04 ENCOUNTER — Ambulatory Visit: Payer: 59 | Admitting: Cardiology

## 2022-10-04 ENCOUNTER — Encounter: Payer: Self-pay | Admitting: Cardiology

## 2022-10-04 VITALS — BP 142/89 | HR 89 | Resp 16 | Ht 67.0 in | Wt 194.0 lb

## 2022-10-04 DIAGNOSIS — R0609 Other forms of dyspnea: Secondary | ICD-10-CM

## 2022-10-04 DIAGNOSIS — E782 Mixed hyperlipidemia: Secondary | ICD-10-CM

## 2022-10-04 DIAGNOSIS — R9431 Abnormal electrocardiogram [ECG] [EKG]: Secondary | ICD-10-CM

## 2022-10-04 DIAGNOSIS — I10 Essential (primary) hypertension: Secondary | ICD-10-CM

## 2022-10-04 DIAGNOSIS — E119 Type 2 diabetes mellitus without complications: Secondary | ICD-10-CM

## 2022-10-04 NOTE — Progress Notes (Signed)
ID:  Jenna Myers, DOB 1965-10-05, MRN [id number]  PCP:  Ellender Hose, NP  Cardiologist:  Tessa Lerner, DO, [facility] (established care 10/04/22)  REASON FOR CONSULT: Abnormal EKG  REQUESTING PHYSICIAN:  Ellender Hose, NP [address] [place],  [place] [address]  Chief Complaint  Patient presents with   Abnormal ECG   New Patient (Initial Visit)    HPI  Jenna Myers is a 57 y.o. African-American female who presents to the clinic for evaluation of abnormal EKG at the request of Ellender Hose, NP. Her past medical history and cardiovascular risk factors include: Non-insulin-dependent diabetes mellitus type 2, Hyperlipidemia,, hypertension  Patient is referred to the practice for evaluation of abnormal EKG.  Clinically she denies anginal chest pain but does have dyspnea on exertion.  Patient states that she is been having dyspnea on exertion for the last several years especially when she goes up a flight of stairs but over the last 1 month it has been more noticeable with regular activity.  She is also had increased personal stress which may be contributory.  No significant change in intensity frequency or duration over the last 1 month.  And the symptoms improved after resting.  Twin brother had surgical revascularization in his 16s, but is unsure of the details.  In the recent past she decided not to be on medications for diabetes and her most recent hemoglobin A1c is greater than 12.  Patient states that her home blood pressures are better controlled with SBP around 130 mmHg on current medical therapy.  She was restarted on statin therapy in August 2024 by PCP.  CARDIAC DATABASE: EKG: 10/04/2022: Sinus rhythm, 92 bpm, without underlying ischemic injury pattern.  Echocardiogram: No results found for this or any previous visit from the past 1095 days.   Stress Testing: No results found for this or any previous visit from the past 1095  days.   ALLERGIES: Allergies  Allergen Reactions   Penicillin G Itching and Rash    MEDICATION LIST PRIOR TO VISIT: Current Meds  Medication Sig   aspirin EC 81 MG tablet Take 81 mg by mouth daily.   atorvastatin (LIPITOR) 20 MG tablet Take 1 tablet (20 mg total) by mouth daily.   brimonidine (ALPHAGAN) 0.2 % ophthalmic solution 1 drop 2 (two) times daily.   Continuous Blood Gluc Receiver (FREESTYLE LIBRE 2 READER) DEVI Use to check sugars   Continuous Blood Gluc Sensor (FREESTYLE LIBRE 2 SENSOR) MISC Use to check sugars.   glipiZIDE (GLUCOTROL XL) 5 MG 24 hr tablet Take 1 tablet (5 mg total) by mouth daily with breakfast.   hydrochlorothiazide (HYDRODIURIL) 25 MG tablet Take 1 tablet (25 mg total) by mouth daily. Due for physical   lisinopril (ZESTRIL) 20 MG tablet Take 1 tablet (20 mg total) by mouth daily. Due for physical   Multiple Vitamins-Minerals (MULTIVITAMIN ADULT PO) Take 1 capsule by mouth daily.   timolol (BETIMOL) 0.5 % ophthalmic solution 1 drop 2 (two) times daily.     PAST MEDICAL HISTORY: Past Medical History:  Diagnosis Date   Allergy    seasonal   Cancer (HCC) MOTHER   WILLETTE   Constipation    prune juice and probiotic helps   Diabetes mellitus without complication (HCC)    questionable-Hgb A1C down to 6.7   Heart murmur 2010   Hyperlipidemia associated with type 2 diabetes mellitus (HCC) 03/25/2019   Hypertension    Stroke (HCC) FATHER   MITCHELL  PAST SURGICAL HISTORY: Past Surgical History:  Procedure Laterality Date   CORONARY ARTERY BYPASS GRAFT  Father   Clovis Riley   VAGINAL DELIVERY     x1    FAMILY HISTORY: The patient family history includes Diabetes in her father; Heart disease in her brother and father; Hypertension in her father; Stroke in her father.  SOCIAL HISTORY:  The patient  reports that she has never smoked. She has never used smokeless tobacco. She reports that she does not drink alcohol and does not use  drugs.  REVIEW OF SYSTEMS: Review of Systems  Cardiovascular:  Positive for dyspnea on exertion. Negative for chest pain, claudication, irregular heartbeat, leg swelling, near-syncope, orthopnea, palpitations, paroxysmal nocturnal dyspnea and syncope.  Respiratory:  Negative for shortness of breath.   Hematologic/Lymphatic: Negative for bleeding problem.  Musculoskeletal:  Negative for muscle cramps and myalgias.  Neurological:  Negative for dizziness and light-headedness.    PHYSICAL EXAM:    10/04/2022    9:23 AM 10/04/2022    8:55 AM 10/04/2022    8:50 AM  Vitals with BMI  Height   5\' 7"   Weight   194 lbs  BMI   30.38  Systolic 142 168 932  Diastolic 89 114 132  Pulse 89 93 94    Physical Exam  Constitutional: No distress.  Age appropriate, hemodynamically stable.   Neck: No JVD present.  Cardiovascular: Normal rate, regular rhythm, S1 normal, S2 normal, intact distal pulses and normal pulses. Exam reveals no gallop, no S3 and no S4.  No murmur heard. Pulmonary/Chest: Effort normal and breath sounds normal. No stridor. She has no wheezes. She has no rales.  Abdominal: Soft. Bowel sounds are normal. She exhibits no distension. There is no abdominal tenderness.  Musculoskeletal:        General: No edema.     Cervical back: Neck supple.  Neurological: She is alert and oriented to person, place, and time. She has intact cranial nerves (2-12).  Skin: Skin is warm and moist.    LABORATORY DATA:    Latest Ref Rng & Units 08/30/2022    9:16 AM 04/16/2008   10:41 PM  CBC  WBC 3.4 - 10.8 x10E3/uL 5.3  6.9   Hemoglobin 11.1 - 15.9 g/dL 35.5  73.2   Hematocrit 34.0 - 46.6 % 39.0  36.6   Platelets 150 - 450 x10E3/uL 279  389        Latest Ref Rng & Units 08/30/2022    9:16 AM 05/10/2021    7:42 AM 03/25/2019    8:03 AM  CMP  Glucose 70 - 99 mg/dL 202  542  706   BUN 6 - 24 mg/dL 11  8  7    Creatinine 0.57 - 1.00 mg/dL 2.37  6.28  3.15   Sodium 134 - 144 mmol/L 134  135  138    Potassium 3.5 - 5.2 mmol/L 3.8  3.5  3.9   Chloride 96 - 106 mmol/L 95  97  100   CO2 20 - 29 mmol/L 25  28  27    Calcium 8.7 - 10.2 mg/dL 9.2  9.3  9.4   Total Protein 6.0 - 8.5 g/dL 6.6  7.5  7.3   Total Bilirubin 0.0 - 1.2 mg/dL 0.3  0.5  0.4   Alkaline Phos 44 - 121 IU/L 88  83  95   AST 0 - 40 IU/L 21  23  21    ALT 0 - 32 IU/L 17  30  22     Lab Results  Component Value Date   CHOL 162 08/30/2022   HDL 59 08/30/2022   LDLCALC 76 08/30/2022   TRIG 161 (H) 08/30/2022   CHOLHDL 2.7 08/30/2022   No components found for: "NTPROBNP" No results for input(s): "PROBNP" in the last 8760 hours. No results for input(s): "TSH" in the last 8760 hours.  BMP Recent Labs    08/30/22 0916  NA 134  K 3.8  CL 95*  CO2 25  GLUCOSE 226*  BUN 11  CREATININE 0.75  CALCIUM 9.2    HEMOGLOBIN A1C Lab Results  Component Value Date   HGBA1C 12.3 (H) 08/30/2022    IMPRESSION:    ICD-10-CM   1. Abnormal EKG  R94.31 EKG 12-Lead    2. Dyspnea on exertion  R06.09 PCV ECHOCARDIOGRAM COMPLETE    CT CARDIAC SCORING (SELF PAY ONLY)    3. Benign hypertension  I10 PCV ECHOCARDIOGRAM COMPLETE    CT CARDIAC SCORING (SELF PAY ONLY)    4. Non-insulin dependent type 2 diabetes mellitus (HCC)  E11.9 PCV ECHOCARDIOGRAM COMPLETE    CT CARDIAC SCORING (SELF PAY ONLY)    5. Mixed hyperlipidemia  E78.2        RECOMMENDATIONS: Kurstie Philson is a 57 y.o. African-American female whose past medical history and cardiac risk factors include: Non-insulin-dependent diabetes mellitus type 2, Hyperlipidemia,, hypertension.   Abnormal EKG EKG today illustrates sinus rhythm without ischemia or injury pattern. She does have electrocardiogram findings of possible right atrial enlargement. Echo will be ordered to evaluate for structural heart disease and left ventricular systolic function.  Dyspnea on exertion Chronic, but more progressive over the last 1 month. Given her symptoms and multiple  cardiovascular risk factors including family history of premature CAD and uncontrolled diabetes melitis type II recommend ischemic evaluation. Will start with coronary calcium score for further risk stratification.  Depending on the coronary calcium score will determine either GXT or exercise nuclear stress test or coronary CTA depending on symptoms.  Benign hypertension Office blood pressures are not well-controlled. Home blood pressures according to the patient range between 128-130 mmHg and diastolic blood pressures range between 70-80 mmHg. Will continue current medical therapy. Currently managed by primary care provider.  Non-insulin dependent type 2 diabetes mellitus (HCC) Most recent hemoglobin A1c as of October 2024 12.3. Reemphasized importance of glycemic control. Currently managed by primary care provider.  Mixed hyperlipidemia Currently on atorvastatin.   She denies myalgia or other side effects. Most recent lipids dated August 2024, independently reviewed as noted above. Currently managed by primary care provider. Started on atorvastatin in August 2024.  FINAL MEDICATION LIST END OF ENCOUNTER: No orders of the defined types were placed in this encounter.   Medications Discontinued During This Encounter  Medication Reason   fluconazole (DIFLUCAN) 150 MG tablet    nystatin-triamcinolone (MYCOLOG II) cream      Current Outpatient Medications:    aspirin EC 81 MG tablet, Take 81 mg by mouth daily., Disp: , Rfl:    atorvastatin (LIPITOR) 20 MG tablet, Take 1 tablet (20 mg total) by mouth daily., Disp: 30 tablet, Rfl: 11   brimonidine (ALPHAGAN) 0.2 % ophthalmic solution, 1 drop 2 (two) times daily., Disp: , Rfl:    Continuous Blood Gluc Receiver (FREESTYLE LIBRE 2 READER) DEVI, Use to check sugars, Disp: 1 each, Rfl: 3   Continuous Blood Gluc Sensor (FREESTYLE LIBRE 2 SENSOR) MISC, Use to check sugars., Disp: 3 each, Rfl: 3  glipiZIDE (GLUCOTROL XL) 5 MG 24 hr tablet,  Take 1 tablet (5 mg total) by mouth daily with breakfast., Disp: 90 tablet, Rfl: 1   hydrochlorothiazide (HYDRODIURIL) 25 MG tablet, Take 1 tablet (25 mg total) by mouth daily. Due for physical, Disp: 90 tablet, Rfl: 1   lisinopril (ZESTRIL) 20 MG tablet, Take 1 tablet (20 mg total) by mouth daily. Due for physical, Disp: 90 tablet, Rfl: 1   Multiple Vitamins-Minerals (MULTIVITAMIN ADULT PO), Take 1 capsule by mouth daily., Disp: , Rfl:    timolol (BETIMOL) 0.5 % ophthalmic solution, 1 drop 2 (two) times daily., Disp: , Rfl:    dapagliflozin propanediol (FARXIGA) 10 MG TABS tablet, TAKE 1 TABLET BY MOUTH ONCE DAILY BEFORE BREAKFAST (Patient not taking: Reported on 10/04/2022), Disp: 30 tablet, Rfl: 3  Orders Placed This Encounter  Procedures   CT CARDIAC SCORING (SELF PAY ONLY)   EKG 12-Lead   PCV ECHOCARDIOGRAM COMPLETE    There are no Patient Instructions on file for this visit.   --Continue cardiac medications as reconciled in final medication list. --Return in about 8 weeks (around 11/29/2022) for Reevaluation of, Dyspnea, Review test results. or sooner if needed. --Continue follow-up with your primary care physician regarding the management of your other chronic comorbid conditions.  Patient's questions and concerns were addressed to her satisfaction. She voices understanding of the instructions provided during this encounter.   This note was created using a voice recognition software as a result there may be grammatical errors inadvertently enclosed that do not reflect the nature of this encounter. Every attempt is made to correct such errors.  Tessa Lerner, [place], [facility]  Pager:  [phone] Office: [phone]

## 2022-10-12 ENCOUNTER — Ambulatory Visit (HOSPITAL_COMMUNITY)
Admission: RE | Admit: 2022-10-12 | Discharge: 2022-10-12 | Disposition: A | Discharge status: Home / Self Care | Payer: 59 | Source: Ambulatory Visit | Attending: Cardiology | Admitting: Cardiology

## 2022-10-12 DIAGNOSIS — R0609 Other forms of dyspnea: Secondary | ICD-10-CM | POA: Insufficient documentation

## 2022-10-12 DIAGNOSIS — E119 Type 2 diabetes mellitus without complications: Secondary | ICD-10-CM | POA: Insufficient documentation

## 2022-10-12 DIAGNOSIS — I10 Essential (primary) hypertension: Secondary | ICD-10-CM | POA: Insufficient documentation

## 2022-10-13 ENCOUNTER — Other Ambulatory Visit: Payer: Self-pay | Admitting: Cardiology

## 2022-10-13 DIAGNOSIS — R931 Abnormal findings on diagnostic imaging of heart and coronary circulation: Secondary | ICD-10-CM

## 2022-10-13 DIAGNOSIS — R0609 Other forms of dyspnea: Secondary | ICD-10-CM

## 2022-10-13 NOTE — Progress Notes (Signed)
Called patient, Jenna Myers, Jenna Myers

## 2022-10-25 ENCOUNTER — Encounter (HOSPITAL_COMMUNITY): Payer: Self-pay

## 2022-10-31 ENCOUNTER — Other Ambulatory Visit: Payer: Self-pay | Admitting: Cardiology

## 2022-10-31 DIAGNOSIS — R0609 Other forms of dyspnea: Secondary | ICD-10-CM

## 2022-10-31 DIAGNOSIS — R931 Abnormal findings on diagnostic imaging of heart and coronary circulation: Secondary | ICD-10-CM

## 2022-10-31 DIAGNOSIS — R9431 Abnormal electrocardiogram [ECG] [EKG]: Secondary | ICD-10-CM

## 2022-11-03 ENCOUNTER — Ambulatory Visit: Payer: 59 | Attending: Cardiology

## 2022-11-03 DIAGNOSIS — R931 Abnormal findings on diagnostic imaging of heart and coronary circulation: Secondary | ICD-10-CM

## 2022-11-03 DIAGNOSIS — R0609 Other forms of dyspnea: Secondary | ICD-10-CM

## 2022-11-03 LAB — EXERCISE TOLERANCE TEST
Angina Index: 0
Base ST Depression (mm): 0 mm
Duke Treadmill Score: 8
Estimated workload: 10.1
Exercise duration (min): 8 min
Exercise duration (sec): 0 s
MPHR: 162 {beats}/min
Peak HR: 151 {beats}/min
Percent HR: 93 %
RPE: 16
Rest HR: 88 {beats}/min
ST Depression (mm): 0 mm

## 2022-11-18 ENCOUNTER — Ambulatory Visit (INDEPENDENT_AMBULATORY_CARE_PROVIDER_SITE_OTHER): Payer: 59

## 2022-11-18 VITALS — BP 110/80 | HR 84 | Temp 98.8°F | Ht 67.0 in | Wt 194.0 lb

## 2022-11-18 DIAGNOSIS — Z23 Encounter for immunization: Secondary | ICD-10-CM | POA: Diagnosis not present

## 2022-11-18 NOTE — Progress Notes (Signed)
Patient presents today for a flu vaccine. YL,RMA 

## 2022-11-23 ENCOUNTER — Other Ambulatory Visit (HOSPITAL_COMMUNITY): Payer: 59

## 2022-11-25 ENCOUNTER — Telehealth: Payer: Self-pay | Admitting: Cardiology

## 2022-11-25 NOTE — Telephone Encounter (Signed)
Pt would like to know if there was a prior auth done for the echo on 11/4. Pt states [employer] told her she needed one. Please advise

## 2022-11-25 NOTE — Telephone Encounter (Signed)
Left detailed message informing pt that  prior auth is completed/done/approved.

## 2022-11-28 ENCOUNTER — Ambulatory Visit (HOSPITAL_COMMUNITY): Payer: 59 | Attending: Internal Medicine

## 2022-11-28 DIAGNOSIS — I10 Essential (primary) hypertension: Secondary | ICD-10-CM | POA: Diagnosis present

## 2022-11-28 DIAGNOSIS — R0609 Other forms of dyspnea: Secondary | ICD-10-CM | POA: Insufficient documentation

## 2022-11-28 DIAGNOSIS — E119 Type 2 diabetes mellitus without complications: Secondary | ICD-10-CM | POA: Diagnosis present

## 2022-11-28 LAB — ECHOCARDIOGRAM COMPLETE
Area-P 1/2: 4.02 cm2
S' Lateral: 2.2 cm

## 2022-11-30 ENCOUNTER — Ambulatory Visit: Payer: Self-pay | Admitting: Cardiology

## 2022-12-01 ENCOUNTER — Telehealth: Payer: Self-pay | Admitting: Cardiology

## 2022-12-01 NOTE — Telephone Encounter (Signed)
Pt states she is very eager to know her results of the ECHO. Please advise

## 2022-12-01 NOTE — Telephone Encounter (Signed)
Spoke with pt and advised Dr Odis Hollingshead has not yet reviewed echo but once he has done this we will contact pt with results and any recommendations.  Pt verbalizes understanding and agrees with current plan

## 2022-12-02 NOTE — Telephone Encounter (Signed)
Results and provider comments released to pt's MyChart

## 2022-12-02 NOTE — Telephone Encounter (Signed)
Please see result note  Rex Kras, DO, [facility]

## 2022-12-12 ENCOUNTER — Encounter: Payer: Self-pay | Admitting: Obstetrics & Gynecology

## 2022-12-28 ENCOUNTER — Ambulatory Visit: Payer: 59 | Admitting: Family Medicine

## 2023-01-04 ENCOUNTER — Ambulatory Visit: Payer: 59 | Admitting: Family Medicine

## 2023-01-04 ENCOUNTER — Encounter: Payer: Self-pay | Admitting: Family Medicine

## 2023-01-04 VITALS — BP 160/80 | HR 93 | Temp 98.9°F | Ht 67.0 in | Wt 189.0 lb

## 2023-01-04 DIAGNOSIS — E1165 Type 2 diabetes mellitus with hyperglycemia: Secondary | ICD-10-CM | POA: Diagnosis not present

## 2023-01-04 DIAGNOSIS — I10 Essential (primary) hypertension: Secondary | ICD-10-CM

## 2023-01-04 DIAGNOSIS — E785 Hyperlipidemia, unspecified: Secondary | ICD-10-CM | POA: Diagnosis not present

## 2023-01-04 DIAGNOSIS — E1169 Type 2 diabetes mellitus with other specified complication: Secondary | ICD-10-CM | POA: Diagnosis not present

## 2023-01-04 MED ORDER — RYBELSUS 3 MG PO TABS: 3.0000 mg | ORAL_TABLET | Freq: Every day | ORAL | 2 refills | Status: AC

## 2023-01-04 MED ORDER — EZETIMIBE 10 MG PO TABS
10.0000 mg | ORAL_TABLET | Freq: Every day | ORAL | 5 refills | Status: AC
Start: 2023-01-04 — End: 2024-01-04

## 2023-01-04 NOTE — Progress Notes (Signed)
I,Jameka J Llittleton, CMA,acting as a Neurosurgeon for [employer], NP.,have documented all relevant documentation on the behalf of Ellender Hose, NP,as directed by  Ellender Hose, NP while in the presence of Ellender Hose, NP.  Subjective:  Patient ID: Jenna Myers , female    DOB: 1965/08/27 , 57 y.o.   MRN: [id number]  Chief Complaint  Patient presents with   Hypertension   Diabetes    HPI  Patient is a 57 year old female who presents today for diabetes and hypertension management. Discussed labs with her again and the need to be compliant with taking all her medication.Patient denies having SOB, chest pain or headaches. Patient reports she stopped taking Atorvastatin due to side effects an she stopped taking [place] because it is not covered by her insurance. She stated the Rybelsus and ozempic would be $50 per month and she can't  patient afford that them either.  Gave her samples of Rybelsus 3 mg every day.          Past Medical History:  Diagnosis Date   Allergy    seasonal   Cancer (HCC) MOTHER   WILLETTE   Constipation    prune juice and probiotic helps   Diabetes mellitus without complication (HCC)    questionable-Hgb A1C down to 6.7   Heart murmur 2010   Hyperlipidemia associated with type 2 diabetes mellitus (HCC) 03/25/2019   Hypertension    Stroke (HCC) FATHER   MITCHELL     Family History  Problem Relation Age of Onset   Heart disease Father    Diabetes Father    Hypertension Father    Stroke Father    Heart disease Brother    Colon cancer Neg Hx      Current Outpatient Medications:    aspirin EC 81 MG tablet, Take 81 mg by mouth daily., Disp: , Rfl:    atorvastatin (LIPITOR) 20 MG tablet, Take 1 tablet (20 mg total) by mouth daily., Disp: 30 tablet, Rfl: 11   brimonidine (ALPHAGAN) 0.2 % ophthalmic solution, 1 drop 2 (two) times daily., Disp: , Rfl:    ezetimibe (ZETIA) 10 MG tablet, Take 1 tablet (10 mg total) by mouth daily., Disp: 30 tablet, Rfl:  5   glipiZIDE (GLUCOTROL XL) 5 MG 24 hr tablet, Take 1 tablet (5 mg total) by mouth daily with breakfast., Disp: 90 tablet, Rfl: 1   hydrochlorothiazide (HYDRODIURIL) 25 MG tablet, Take 1 tablet (25 mg total) by mouth daily. Due for physical, Disp: 90 tablet, Rfl: 1   lisinopril (ZESTRIL) 20 MG tablet, Take 1 tablet (20 mg total) by mouth daily. Due for physical, Disp: 90 tablet, Rfl: 1   Multiple Vitamins-Minerals (MULTIVITAMIN ADULT PO), Take 1 capsule by mouth daily., Disp: , Rfl:    Semaglutide (RYBELSUS) 3 MG TABS, Take 1 tablet (3 mg total) by mouth daily., Disp: 90 tablet, Rfl: 2   timolol (BETIMOL) 0.5 % ophthalmic solution, 1 drop 2 (two) times daily., Disp: , Rfl:    Allergies  Allergen Reactions   Penicillin G Itching and Rash     Review of Systems  Constitutional: Negative.   HENT: Negative.    Eyes: Negative.   Respiratory: Negative.    Cardiovascular: Negative.   Musculoskeletal: Negative.   Skin: Negative.   Neurological: Negative.   Psychiatric/Behavioral: Negative.       Today's Vitals   01/04/23 1120 01/04/23 1229  BP: (!) 140/90 (!) 160/80  Pulse: 93   Temp: 98.9 F (37.2 C)  Weight: 189 lb (85.7 kg)   Height: 5\' 7"  (1.702 m)   PainSc: 0-No pain    Body mass index is 29.6 kg/m.  Wt Readings from Last 3 Encounters:  01/04/23 189 lb (85.7 kg)  11/18/22 194 lb (88 kg)  10/04/22 194 lb (88 kg)    The ASCVD Risk score (Arnett DK, et al., 2019) failed to calculate for the following reasons:   Risk score cannot be calculated because patient has a medical history suggesting prior/existing ASCVD  Objective:  Physical Exam Cardiovascular:     Rate and Rhythm: Normal rate and regular rhythm.  Pulmonary:     Effort: Pulmonary effort is normal.     Breath sounds: Normal breath sounds.  Skin:    General: Skin is warm and dry.  Neurological:     Mental Status: She is alert and oriented to person, place, and time.  Psychiatric:        Behavior: Behavior  normal.         Assessment And Plan:  Type 2 diabetes mellitus with hyperglycemia, without long-term current use of insulin (HCC) -     Hemoglobin A1c  Benign essential hypertension -     CBC -     CMP14+EGFR  Hyperlipidemia associated with type 2 diabetes mellitus (HCC)  Other orders -     Ezetimibe; Take 1 tablet (10 mg total) by mouth daily.  Dispense: 30 tablet; Refill: 5 -     Rybelsus; Take 1 tablet (3 mg total) by mouth daily.  Dispense: 90 tablet; Refill: 2    Return in 3 months (on 04/04/2023) for uncontrolled DM check-3 months, diabetes, Nurse visit BP in 1 week.  Patient was given opportunity to ask questions. Patient verbalized understanding of the plan and was able to repeat key elements of the plan. All questions were answered to their satisfaction.    I, Ellender Hose, NP, have reviewed all documentation for this visit. The documentation on 01/09/23 for the exam, diagnosis, procedures, and orders are all accurate and complete.   IF YOU HAVE BEEN REFERRED TO A SPECIALIST, IT MAY TAKE 1-2 WEEKS TO SCHEDULE/PROCESS THE REFERRAL. IF YOU HAVE NOT HEARD FROM US/SPECIALIST IN TWO WEEKS, PLEASE GIVE [place] A CALL AT [phone].

## 2023-01-05 LAB — CMP14+EGFR
ALT: 27 [IU]/L (ref 0–32)
AST: 26 [IU]/L (ref 0–40)
Albumin: 4.2 g/dL (ref 3.8–4.9)
Alkaline Phosphatase: 95 [IU]/L (ref 44–121)
BUN/Creatinine Ratio: 14 (ref 9–23)
BUN: 11 mg/dL (ref 6–24)
Bilirubin Total: 0.4 mg/dL (ref 0.0–1.2)
CO2: 18 mmol/L — ABNORMAL LOW (ref 20–29)
Calcium: 9.3 mg/dL (ref 8.7–10.2)
Chloride: 96 mmol/L (ref 96–106)
Creatinine, Ser: 0.79 mg/dL (ref 0.57–1.00)
Globulin, Total: 3.2 g/dL (ref 1.5–4.5)
Glucose: 94 mg/dL (ref 70–99)
Potassium: 4 mmol/L (ref 3.5–5.2)
Sodium: 136 mmol/L (ref 134–144)
Total Protein: 7.4 g/dL (ref 6.0–8.5)
eGFR: 87 mL/min/{1.73_m2} (ref >59)

## 2023-01-05 LAB — CBC
Hematocrit: 43.1 % (ref 34.0–46.6)
Hemoglobin: 14.2 g/dL (ref 11.1–15.9)
MCH: 29.7 pg (ref 26.6–33.0)
MCHC: 32.9 g/dL (ref 31.5–35.7)
MCV: 90 fL (ref 79–97)
Platelets: 321 10*3/uL (ref 150–450)
RBC: 4.78 x10E6/uL (ref 3.77–5.28)
RDW: 13.3 % (ref 11.7–15.4)
WBC: 5.6 10*3/uL (ref 3.4–10.8)

## 2023-01-05 LAB — HEMOGLOBIN A1C
Est. average glucose Bld gHb Est-mCnc: 312 mg/dL
Hgb A1c MFr Bld: 12.5 % — ABNORMAL HIGH (ref 4.8–5.6)

## 2023-01-10 ENCOUNTER — Ambulatory Visit: Payer: 59

## 2023-01-10 ENCOUNTER — Encounter: Payer: Self-pay | Admitting: Family Medicine

## 2023-01-10 NOTE — Progress Notes (Deleted)
Patient presents today for bpc. She currently takes hydrochlorothiazide 25MG  & Lisinopril 20MG . Denies headache, chest pain & sob.

## 2023-01-10 NOTE — Telephone Encounter (Signed)
 Care team updated and letter sent for eye exam notes.

## 2023-01-12 ENCOUNTER — Telehealth: Payer: Self-pay

## 2023-01-12 ENCOUNTER — Ambulatory Visit: Payer: 59 | Attending: Cardiology | Admitting: Cardiology

## 2023-01-12 VITALS — BP 142/90 | HR 86 | Temp 98.0°F | Resp 10 | Ht 67.0 in | Wt 192.8 lb

## 2023-01-12 DIAGNOSIS — E119 Type 2 diabetes mellitus without complications: Secondary | ICD-10-CM

## 2023-01-12 DIAGNOSIS — E1165 Type 2 diabetes mellitus with hyperglycemia: Secondary | ICD-10-CM

## 2023-01-12 DIAGNOSIS — R9431 Abnormal electrocardiogram [ECG] [EKG]: Secondary | ICD-10-CM | POA: Diagnosis not present

## 2023-01-12 DIAGNOSIS — R931 Abnormal findings on diagnostic imaging of heart and coronary circulation: Secondary | ICD-10-CM

## 2023-01-12 DIAGNOSIS — I10 Essential (primary) hypertension: Secondary | ICD-10-CM | POA: Diagnosis not present

## 2023-01-12 DIAGNOSIS — E782 Mixed hyperlipidemia: Secondary | ICD-10-CM

## 2023-01-12 DIAGNOSIS — R0609 Other forms of dyspnea: Secondary | ICD-10-CM

## 2023-01-12 MED ORDER — ROSUVASTATIN CALCIUM 20 MG PO TABS: 20.0000 mg | ORAL_TABLET | Freq: Every day | ORAL | 3 refills | Status: AC

## 2023-01-12 MED ORDER — LISINOPRIL 20 MG PO TABS: 20.0000 mg | ORAL_TABLET | Freq: Every evening | ORAL | Status: AC

## 2023-01-12 NOTE — Progress Notes (Signed)
Care Guide Pharmacy Note  01/12/2023 Name: Jenna Myers MRN: [id number] DOB: May 24, 1965  Referred By: Ellender Hose, NP Reason for referral: Care Coordination (Outreach to schedule with pharm d )   Jenna Myers is a 57 y.o. year old female who is a primary care patient of Ellender Hose, NP.  Jenna Myers was referred to the pharmacist for assistance related to: DMII  An unsuccessful telephone outreach was attempted today to contact the patient who was referred to the pharmacy team for assistance with medication assistance. Additional attempts will be made to contact the patient.  Penne Lash , RMA     [facility]  [facility], [facility] Guide  Direct Dial: [phone]  Website: [email or web address]

## 2023-01-12 NOTE — Progress Notes (Signed)
Cardiology Office Note:  .   Date:  01/15/2023  ID:  Jenna Myers, DOB Jun 30, 1965, MRN [id number] PCP:  Ellender Hose, NP  Former Cardiology Providers: None [facility] Providers Cardiologist:  Tessa Lerner, DO , [facility] (established care 10/04/2022) Electrophysiologist:  None  Click to update primary MD,subspecialty MD or APP then REFRESH:1}    Chief Complaint  Patient presents with   Results   Follow-up    3 month     History of Present Illness: .   Jenna Myers is a 57 y.o. African-American female whose past medical history and cardiovascular risk factors includes: Non-insulin-dependent diabetes mellitus type 2, hyperlipidemia, hypertension.  She was referred to the practice in September 2024 for an abnormal EKG.  Clinically she is experiencing shortness of breath predominantly with effort related activities and given multiple cardiovascular risk factors age I decision was to proceed with coronary calcium score as well as echocardiogram.  Of note, patient is hemoglobin A1c as of October 2024 was 12.3 as she decided not to be on medications for diabetes.  She presents today for follow-up.  Since last office visit she denies any anginal chest pain or heart failure symptoms.  She has made meaningful improvements with regards to improving her physical activity on a regular basis.  She been walking at least 1 mile and gradually increasing it to 2 miles on a regular basis.  She feels that the breathing has gotten better.  Home blood pressures have remained relatively stable but with wide variability in readings.  Despite increasing physical activity patient has gained weight and she attributed to dietary indiscretion.  Patient discontinued Lipitor secondary to hives. Currently on Zeita.   Twin brother had surgical revascularization in his 9s, unsure of the details.  Review of Systems: .   Review of Systems  Cardiovascular:  Negative for chest pain, claudication,  irregular heartbeat, leg swelling, near-syncope, orthopnea, palpitations, paroxysmal nocturnal dyspnea and syncope.  Respiratory:  Negative for shortness of breath.   Hematologic/Lymphatic: Negative for bleeding problem.    Studies Reviewed:   EKG: 10/04/2022: Sinus rhythm, 92 bpm, without underlying ischemic injury pattern.  Echocardiogram: November 28, 2022: LVEF 60-65%, normal diastolic function, average GLS -25.7%. Right ventricular size and function normal. No significant valvular heart disease. Estimated RAP 3 mmHg.   Stress Testing: Exercise treadmill stress test October 2024: Patient exercised for 8 minutes, achieved 10.1 METS, 93% of age-predicted maximum heart rate. Hypertensive response to exercise. Low risk study  CT Cardiac Scoring: 10/12/2022 Total CAC 44.8 AU, 86th percentile for patient's age, sex, and race. Noncardiac findings:  Left upper quadrant nodularity most likely representing splenosis. Minimal basilar and right middle lobe subsegmental atelectasis. Otherwise no significant extracardiac incidental findings identified.   RADIOLOGY: N/A  Risk Assessment/Calculations:   N/A   Labs:       Latest Ref Rng & Units 01/04/2023   12:37 PM 08/30/2022    9:16 AM 04/16/2008   10:41 PM  CBC  WBC 3.4 - 10.8 x10E3/uL 5.6  5.3  6.9   Hemoglobin 11.1 - 15.9 g/dL 78.2  95.6  21.3   Hematocrit 34.0 - 46.6 % 43.1  39.0  36.6   Platelets 150 - 450 x10E3/uL 321  279  389        Latest Ref Rng & Units 01/04/2023   12:37 PM 08/30/2022    9:16 AM 05/10/2021    7:42 AM  BMP  Glucose 70 - 99 mg/dL 94  086  578  BUN 6 - 24 mg/dL 11  11  8    Creatinine 0.57 - 1.00 mg/dL 6.04  5.40  9.81   BUN/Creat Ratio 9 - 23 14  15     Sodium 134 - 144 mmol/L 136  134  135   Potassium 3.5 - 5.2 mmol/L 4.0  3.8  3.5   Chloride 96 - 106 mmol/L 96  95  97   CO2 20 - 29 mmol/L 18  25  28    Calcium 8.7 - 10.2 mg/dL 9.3  9.2  9.3       Latest Ref Rng & Units 01/04/2023   12:37 PM  08/30/2022    9:16 AM 05/10/2021    7:42 AM  CMP  Glucose 70 - 99 mg/dL 94  191  478   BUN 6 - 24 mg/dL 11  11  8    Creatinine 0.57 - 1.00 mg/dL 2.95  6.21  3.08   Sodium 134 - 144 mmol/L 136  134  135   Potassium 3.5 - 5.2 mmol/L 4.0  3.8  3.5   Chloride 96 - 106 mmol/L 96  95  97   CO2 20 - 29 mmol/L 18  25  28    Calcium 8.7 - 10.2 mg/dL 9.3  9.2  9.3   Total Protein 6.0 - 8.5 g/dL 7.4  6.6  7.5   Total Bilirubin 0.0 - 1.2 mg/dL 0.4  0.3  0.5   Alkaline Phos 44 - 121 IU/L 95  88  83   AST 0 - 40 IU/L 26  21  23    ALT 0 - 32 IU/L 27  17  30      Lab Results  Component Value Date   CHOL 162 08/30/2022   HDL 59 08/30/2022   LDLCALC 76 08/30/2022   TRIG 161 (H) 08/30/2022   CHOLHDL 2.7 08/30/2022   No results for input(s): "LIPOA" in the last 8760 hours. No components found for: "NTPROBNP" No results for input(s): "PROBNP" in the last 8760 hours. No results for input(s): "TSH" in the last 8760 hours.   Physical Exam:    Today's Vitals   01/12/23 1340  BP: (!) 142/90  Pulse: 86  Resp: 10  Temp: 98 F (36.7 C)  Weight: 192 lb 12.8 oz (87.5 kg)  Height: 5\' 7"  (1.702 m)   Body mass index is 30.2 kg/m. Wt Readings from Last 3 Encounters:  01/12/23 192 lb 12.8 oz (87.5 kg)  01/04/23 189 lb (85.7 kg)  11/18/22 194 lb (88 kg)    Physical Exam  Constitutional: No distress.  Age appropriate, hemodynamically stable.   Neck: No JVD present.  Cardiovascular: Normal rate, regular rhythm, S1 normal, S2 normal, intact distal pulses and normal pulses. Exam reveals no gallop, no S3 and no S4.  No murmur heard. Pulmonary/Chest: Effort normal and breath sounds normal. No stridor. She has no wheezes. She has no rales.  Abdominal: Soft. Bowel sounds are normal. She exhibits no distension. There is no abdominal tenderness.  Musculoskeletal:        General: No edema.     Cervical back: Neck supple.  Neurological: She is alert and oriented to person, place, and time. She has intact  cranial nerves (2-12).  Skin: Skin is warm and moist.     Impression & Recommendation(s):  Impression:   ICD-10-CM   1. Dyspnea on exertion  R06.09     2. Abnormal EKG  R94.31     3. Agatston CAC score, <100  R93.1  4. Benign hypertension  I10 lisinopril (ZESTRIL) 20 MG tablet    5. Type 2 diabetes mellitus with hyperglycemia, without long-term current use of insulin (HCC)  E11.65     6. Mixed hyperlipidemia  E78.2 Comprehensive metabolic panel    Lipid panel    LDL cholesterol, direct    rosuvastatin (CRESTOR) 20 MG tablet    LDL cholesterol, direct    Lipid panel    Comprehensive metabolic panel       Recommendation(s):  Dyspnea on exertion Abnormal EKG Her dyspnea has improved since last office visit. Home blood pressures have also improved which may be contributory factor. She is slowly increase her physical activity and her physical endurance is improving. Has undergone appropriate ischemic workup as outlined above which includes but not limited to echocardiogram, stress test, coronary calcium score.  These diagnostic results were reviewed with her in detail and noted above for further reference. No additional testing warranted at this time  Agatston CAC score, <100 Total CAC 44.8 AU, 86th percentile for patient's age, sex, and race.  Continue aspirin and statin therapy. Reemphasized the importance of secondary prevention with focus on improving her modifiable cardiovascular risk factors such as glycemic control, lipid management, blood pressure control, weight loss.  Benign hypertension Home blood pressures range between SBP 130-142 mmHg and diastolic blood pressures range between 82-94 mmHg. For now recommended she takes hydrochlorothiazide morning and lisinopril at night. Lisinopril 20 mg p.o. daily refilled. Reemphasized importance of low-salt diet.  Non-insulin dependent type 2 diabetes mellitus (HCC) Most recent hemoglobin A1c 12.7 as of August  2024. Reemphasized the importance of glycemic control. Currently on glipizide, Rybelsus, statin therapy  Mixed hyperlipidemia Given the coronary calcium score patient was started on statin therapy.   However she noted having hives secondary to Lipitor. Most recent lipid profile from August 30, 2022 notes an LDL of 76 mg/dL. After shared decision making patient is willing to try Crestor 20 mg p.o. nightly with close monitoring for side effect profile If she is able to tolerate Crestor well repeat fasting lipids and CMP in 6 weeks to reevaluate therapy  Splenosis Incidental finding on CT scan. Requires further evaluation to rule out significant pathology. Discussed with primary care physician for further evaluation, possibly an ultrasound of the liver. -Will defer to PCP for now - Consider ultrasound of the liver   Orders Placed:  Orders Placed This Encounter  Procedures   Comprehensive metabolic panel    Standing Status:   Future    Number of Occurrences:   1    Expected Date:   02/23/2023    Expiration Date:   01/12/2024   Lipid panel    Standing Status:   Future    Number of Occurrences:   1    Expected Date:   02/23/2023    Expiration Date:   01/12/2024   LDL cholesterol, direct    Standing Status:   Future    Number of Occurrences:   1    Expected Date:   02/23/2023    Expiration Date:   01/12/2024   Discussed management of at least 2 chronic comorbid conditions, reviewed the results of the echocardiogram, coronary calcium score, and echocardiogram.  Shared decision was to proceed forward with a trial of Crestor medication profile discussed, follow-up labs scheduled.  Given her risk factors shared decision was to follow-up on an annual basis sooner if needed.  Final Medication List:    Meds ordered this encounter  Medications  rosuvastatin (CRESTOR) 20 MG tablet    Sig: Take 1 tablet (20 mg total) by mouth daily.    Dispense:  90 tablet    Refill:  3   lisinopril  (ZESTRIL) 20 MG tablet    Sig: Take 1 tablet (20 mg total) by mouth every evening. Due for physical    Medications Discontinued During This Encounter  Medication Reason   atorvastatin (LIPITOR) 20 MG tablet    lisinopril (ZESTRIL) 20 MG tablet      Current Outpatient Medications:    aspirin EC 81 MG tablet, Take 81 mg by mouth daily., Disp: , Rfl:    brimonidine (ALPHAGAN) 0.2 % ophthalmic solution, 1 drop 2 (two) times daily., Disp: , Rfl:    ezetimibe (ZETIA) 10 MG tablet, Take 1 tablet (10 mg total) by mouth daily., Disp: 30 tablet, Rfl: 5   glipiZIDE (GLUCOTROL XL) 5 MG 24 hr tablet, Take 1 tablet (5 mg total) by mouth daily with breakfast., Disp: 90 tablet, Rfl: 1   hydrochlorothiazide (HYDRODIURIL) 25 MG tablet, Take 1 tablet (25 mg total) by mouth daily. Due for physical, Disp: 90 tablet, Rfl: 1   Multiple Vitamins-Minerals (MULTIVITAMIN ADULT PO), Take 1 capsule by mouth daily., Disp: , Rfl:    rosuvastatin (CRESTOR) 20 MG tablet, Take 1 tablet (20 mg total) by mouth daily., Disp: 90 tablet, Rfl: 3   Semaglutide (RYBELSUS) 3 MG TABS, Take 1 tablet (3 mg total) by mouth daily., Disp: 90 tablet, Rfl: 2   timolol (BETIMOL) 0.5 % ophthalmic solution, 1 drop 2 (two) times daily., Disp: , Rfl:    lisinopril (ZESTRIL) 20 MG tablet, Take 1 tablet (20 mg total) by mouth every evening. Due for physical, Disp: , Rfl:   Consent:   N/A  Disposition:   1 year sooner if needed  Her questions and concerns were addressed to her satisfaction. She voices understanding of the recommendations provided during this encounter.    Signed, Tessa Lerner, DO, [facility]  [facility]  [address] [place], [place] [address] 01/15/2023 9:43 PM

## 2023-01-12 NOTE — Patient Instructions (Signed)
Medication Instructions:  Your physician has recommended you make the following change in your medication:   START Rosuvastatin (Crestor) 20 mg once daily at bedtime  *Start taking Lisinopril in the evening    *If you need a refill on your cardiac medications before your next appointment, please call your pharmacy*  Lab Work: To be completed in 6 weeks: FASTING lipid panel, direct LDL, and CMP  If you have labs (blood work) drawn today and your tests are completely normal, you will receive your results only by: MyChart Message (if you have MyChart) OR A paper copy in the mail If you have any lab test that is abnormal or we need to change your treatment, we will call you to review the results.  Testing/Procedures: None ordered today.  Follow-Up: At [facility], you and your health needs are our priority.  As part of our continuing mission to provide you with exceptional heart care, we have created designated Provider Care Teams.  These Care Teams include your primary Cardiologist (physician) and Advanced Practice Providers (APPs -  Physician Assistants and Nurse Practitioners) who all work together to provide you with the care you need, when you need it.  We recommend signing up for the patient portal called "MyChart".  Sign up information is provided on this After Visit Summary.  MyChart is used to connect with patients for Virtual Visits (Telemedicine).  Patients are able to view lab/test results, encounter notes, upcoming appointments, etc.  Non-urgent messages can be sent to your provider as well.   To learn more about what you can do with MyChart, go to [email or web address].    Your next appointment:   1 year(s)  The format for your next appointment:   In Person  Provider:   Tessa Lerner, DO {

## 2023-01-15 ENCOUNTER — Encounter: Payer: Self-pay | Admitting: Cardiology

## 2023-01-24 ENCOUNTER — Other Ambulatory Visit: Payer: Self-pay | Admitting: Family Medicine

## 2023-01-24 DIAGNOSIS — E0800 Diabetes mellitus due to underlying condition with hyperosmolarity without nonketotic hyperglycemic-hyperosmolar coma (NKHHC): Secondary | ICD-10-CM

## 2023-01-24 DIAGNOSIS — E1165 Type 2 diabetes mellitus with hyperglycemia: Secondary | ICD-10-CM

## 2023-01-24 NOTE — Progress Notes (Signed)
A1c 12.5 . You need to take you medications, I cannot emphasize it enough, in order to avoid cardiovascular complication. Referred to social work and pharmacy for assistance.

## 2023-01-26 ENCOUNTER — Telehealth: Payer: Self-pay | Admitting: *Deleted

## 2023-01-26 NOTE — Progress Notes (Signed)
 Complex Care Management Note Care Guide Note  01/26/2023 Name: Jenna Myers MRN: [id number] DOB: 1965-06-03   Complex Care Management Outreach Attempts: An unsuccessful telephone outreach was attempted today to offer the patient information about available complex care management services.  Follow Up Plan:  Additional outreach attempts will be made to offer the patient complex care management information and services.   Encounter Outcome:  No Answer  Harlene Satterfield  Care Coordination Care Guide  Direct Dial: [phone]

## 2023-02-01 NOTE — Progress Notes (Signed)
 Complex Care Management Note Care Guide Note  02/01/2023 Name: Kyara Boxer MRN: [id number] DOB: [birth year]   Complex Care Management Outreach Attempts: A second unsuccessful outreach was attempted today to offer the patient with information about available complex care management services.  Follow Up Plan:  Additional outreach attempts will be made to offer the patient complex care management information and services.   Encounter Outcome:  No Answer  Harlene Satterfield  Care Coordination Care Guide  Direct Dial: [phone]

## 2023-02-01 NOTE — Progress Notes (Signed)
 Care Guide Pharmacy Note  02/01/2023 Name: [name] MRN: [id number] DOB: 16-Jul-1965  Referred By: Petrina Pries, NP Reason for referral: Care Coordination (Outreach to schedule with pharm d )   Jenna Myers is a 58 y.o. year old female who is a primary care patient of Petrina Pries, NP.  [name] was referred to the pharmacist for assistance related to: DMII  A second unsuccessful telephone outreach was attempted today to contact the patient who was referred to the pharmacy team for assistance with medication management. Additional attempts will be made to contact the patient.  Jeoffrey Buffalo , RMA     [facility]  [facility], [facility] Guide  Direct Dial: [phone]  Website: [email or web address]

## 2023-02-02 ENCOUNTER — Other Ambulatory Visit: Payer: Self-pay | Admitting: Obstetrics & Gynecology

## 2023-02-02 DIAGNOSIS — Z1231 Encounter for screening mammogram for malignant neoplasm of breast: Secondary | ICD-10-CM

## 2023-02-02 NOTE — Progress Notes (Signed)
 Complex Care Management Note Care Guide Note  02/02/2023 Name: Sequoya Hogsett MRN: [id number] DOB: 1965-05-22   Complex Care Management Outreach Attempts: A third unsuccessful outreach was attempted today to offer the patient with information about available complex care management services.  Follow Up Plan:  No further outreach attempts will be made at this time. We have been unable to contact the patient to offer or enroll patient in complex care management services.  Encounter Outcome:  No Answer  Harlene Satterfield  Care Coordination Care Guide  Direct Dial: [phone]

## 2023-02-07 NOTE — Progress Notes (Signed)
 Care Guide Pharmacy Note  02/07/2023 Name: Jenna Myers MRN: [id number] DOB: [birth year]  Referred By: Petrina Pries, NP Reason for referral: Care Coordination (Outreach to schedule with pharm d )   [name] is a 58 y.o. year old female who is a primary care patient of Petrina Pries, NP.  [name] was referred to the pharmacist for assistance related to: DMII  A third unsuccessful telephone outreach was attempted today to contact the patient who was referred to the pharmacy team for assistance with medication management. The Population Health team is pleased to engage with this patient at any time in the future upon receipt of referral and should he/she be interested in assistance from the [employer] [facility] team.  Jeoffrey Buffalo , RMA     [facility]  [facility], [facility] Guide  Direct Dial: [phone]  Website: [email or web address]

## 2023-02-25 ENCOUNTER — Other Ambulatory Visit: Payer: Self-pay | Admitting: Family Medicine

## 2023-02-25 DIAGNOSIS — E1165 Type 2 diabetes mellitus with hyperglycemia: Secondary | ICD-10-CM

## 2023-03-27 ENCOUNTER — Ambulatory Visit
Admission: RE | Admit: 2023-03-27 | Discharge: 2023-03-27 | Disposition: A | Discharge status: Home / Self Care | Payer: 59 | Source: Ambulatory Visit | Attending: Obstetrics & Gynecology | Admitting: Obstetrics & Gynecology

## 2023-03-27 DIAGNOSIS — Z1231 Encounter for screening mammogram for malignant neoplasm of breast: Secondary | ICD-10-CM

## 2023-03-30 ENCOUNTER — Encounter: Payer: Self-pay | Admitting: Obstetrics & Gynecology

## 2023-04-05 ENCOUNTER — Ambulatory Visit: Payer: 59 | Admitting: Family Medicine
